# Patient Record
Sex: Female | Born: 1975 | Race: White | Hispanic: No | Marital: Married | Smoking: Former smoker
Health system: Southern US, Community
[De-identification: ages and names within clinical notes are randomized; demographics above are authoritative.]

## PROBLEM LIST (undated history)

## (undated) DIAGNOSIS — N390 Urinary tract infection, site not specified: Secondary | ICD-10-CM

## (undated) DIAGNOSIS — I10 Essential (primary) hypertension: Secondary | ICD-10-CM

## (undated) DIAGNOSIS — R002 Palpitations: Secondary | ICD-10-CM

## (undated) HISTORY — PX: TONSILLECTOMY: SUR1361

---

## 2006-10-06 ENCOUNTER — Emergency Department: Payer: Self-pay | Admitting: Emergency Medicine

## 2007-10-29 ENCOUNTER — Emergency Department: Payer: Self-pay | Admitting: Emergency Medicine

## 2007-10-30 ENCOUNTER — Other Ambulatory Visit: Payer: Self-pay

## 2008-04-19 ENCOUNTER — Emergency Department (HOSPITAL_COMMUNITY): Admission: EM | Admit: 2008-04-19 | Discharge: 2008-04-19 | Payer: Self-pay | Admitting: Emergency Medicine

## 2009-02-22 ENCOUNTER — Emergency Department (HOSPITAL_COMMUNITY): Admission: EM | Admit: 2009-02-22 | Discharge: 2009-02-22 | Payer: Self-pay | Admitting: Emergency Medicine

## 2009-02-27 ENCOUNTER — Ambulatory Visit: Payer: Self-pay

## 2009-02-27 ENCOUNTER — Encounter: Payer: Self-pay | Admitting: Cardiology

## 2009-02-27 ENCOUNTER — Ambulatory Visit: Payer: Self-pay | Admitting: Cardiology

## 2009-03-02 ENCOUNTER — Telehealth: Payer: Self-pay | Admitting: Cardiology

## 2009-12-03 ENCOUNTER — Emergency Department (HOSPITAL_COMMUNITY): Admission: EM | Admit: 2009-12-03 | Discharge: 2009-12-03 | Payer: Self-pay | Admitting: Family Medicine

## 2010-07-14 NOTE — L&D Delivery Note (Signed)
NSD of viable 7 pound 8 oz. Female, Apgars 9/9, over small 2nd degree episiotomy.  PDI.  Grossly normal.  2A/1V.  Episiotomy repaired as was a small periurethral tear on left.  Breast feed.

## 2010-10-20 LAB — POCT I-STAT, CHEM 8
Chloride: 101 mEq/L (ref 96–112)
HCT: 41 % (ref 36.0–46.0)
Potassium: 3.4 mEq/L — ABNORMAL LOW (ref 3.5–5.1)
TCO2: 26 mmol/L (ref 0–100)

## 2010-10-20 LAB — URINALYSIS, ROUTINE W REFLEX MICROSCOPIC
Bilirubin Urine: NEGATIVE
Glucose, UA: NEGATIVE mg/dL
Ketones, ur: NEGATIVE mg/dL
pH: 6 (ref 5.0–8.0)

## 2010-10-20 LAB — POCT CARDIAC MARKERS
CKMB, poc: <1 ng/mL — ABNORMAL LOW (ref 1.0–8.0)
Myoglobin, poc: 43.6 ng/mL (ref 12–200)
Myoglobin, poc: 44.1 ng/mL (ref 12–200)
Troponin i, poc: <0.05 ng/mL (ref 0.00–0.09)

## 2010-10-20 LAB — DIFFERENTIAL
Basophils Absolute: 0.1 10*3/uL (ref 0.0–0.1)
Basophils Relative: 1 % (ref 0–1)
Lymphocytes Relative: 25 % (ref 12–46)
Monocytes Absolute: 0.7 10*3/uL (ref 0.1–1.0)
Neutro Abs: 5.2 10*3/uL (ref 1.7–7.7)

## 2010-10-20 LAB — CBC
HCT: 39.9 % (ref 36.0–46.0)
Hemoglobin: 13.8 g/dL (ref 12.0–15.0)
Platelets: 288 10*3/uL (ref 150–400)

## 2010-10-20 LAB — D-DIMER, QUANTITATIVE: D-Dimer, Quant: <0.22 ug/mL-FEU (ref 0.00–0.48)

## 2011-06-08 ENCOUNTER — Inpatient Hospital Stay (HOSPITAL_COMMUNITY)
Admission: AD | Admit: 2011-06-08 | Discharge: 2011-06-09 | DRG: 775 | Disposition: A | Discharge status: Home / Self Care | Payer: Medicaid Other | Source: Ambulatory Visit | Attending: Obstetrics & Gynecology | Admitting: Obstetrics & Gynecology

## 2011-06-08 ENCOUNTER — Encounter (HOSPITAL_COMMUNITY): Payer: Self-pay | Admitting: Anesthesiology

## 2011-06-08 ENCOUNTER — Encounter (HOSPITAL_COMMUNITY): Payer: Self-pay | Admitting: Obstetrics & Gynecology

## 2011-06-08 ENCOUNTER — Encounter (HOSPITAL_COMMUNITY): Payer: Self-pay | Admitting: *Deleted

## 2011-06-08 ENCOUNTER — Inpatient Hospital Stay (HOSPITAL_COMMUNITY): Payer: Medicaid Other | Admitting: Anesthesiology

## 2011-06-08 DIAGNOSIS — Z22330 Carrier of Group B streptococcus: Secondary | ICD-10-CM

## 2011-06-08 DIAGNOSIS — O09529 Supervision of elderly multigravida, unspecified trimester: Secondary | ICD-10-CM | POA: Diagnosis present

## 2011-06-08 DIAGNOSIS — O99892 Other specified diseases and conditions complicating childbirth: Principal | ICD-10-CM | POA: Diagnosis present

## 2011-06-08 LAB — RPR: RPR Ser Ql: NONREACTIVE

## 2011-06-08 LAB — ABO/RH
ABO/RH(D): B NEG
RH Type: NEGATIVE

## 2011-06-08 LAB — POCT FERN TEST: Fern Test: POSITIVE

## 2011-06-08 LAB — CBC
MCH: 30.2 pg (ref 26.0–34.0)
Platelets: 227 10*3/uL (ref 150–400)
RBC: 3.67 MIL/uL — ABNORMAL LOW (ref 3.87–5.11)
WBC: 8.2 10*3/uL (ref 4.0–10.5)

## 2011-06-08 MED ORDER — EPHEDRINE 5 MG/ML INJ
10.0000 mg | INTRAVENOUS | Status: DC | PRN
  Filled 2011-06-08: qty 4

## 2011-06-08 MED ORDER — TERBUTALINE SULFATE 1 MG/ML IJ SOLN: 0.2500 mg | Freq: Once | INTRAMUSCULAR | Status: DC | PRN

## 2011-06-08 MED ORDER — GUAIFENESIN-DM 100-10 MG/5ML PO SYRP
5.0000 mL | ORAL_SOLUTION | ORAL | Status: DC | PRN
  Filled 2011-06-08: qty 5

## 2011-06-08 MED ORDER — LANOLIN HYDROUS EX OINT: TOPICAL_OINTMENT | CUTANEOUS | Status: DC | PRN

## 2011-06-08 MED ORDER — OXYTOCIN 20 UNITS IN LACTATED RINGERS INFUSION - SIMPLE: 125.0000 mL/h | Freq: Once | INTRAVENOUS | Status: DC

## 2011-06-08 MED ORDER — LACTATED RINGERS IV SOLN
INTRAVENOUS | Status: DC
  Administered 2011-06-08 (×2): via INTRAVENOUS

## 2011-06-08 MED ORDER — SIMETHICONE 80 MG PO CHEW: 80.0000 mg | CHEWABLE_TABLET | ORAL | Status: DC | PRN

## 2011-06-08 MED ORDER — DIPHENHYDRAMINE HCL 25 MG PO CAPS: 25.0000 mg | ORAL_CAPSULE | Freq: Four times a day (QID) | ORAL | Status: DC | PRN

## 2011-06-08 MED ORDER — SODIUM CHLORIDE 0.9 % IJ SOLN: 3.0000 mL | Freq: Two times a day (BID) | INTRAMUSCULAR | Status: DC

## 2011-06-08 MED ORDER — PENICILLIN G POTASSIUM 5000000 UNITS IJ SOLR
5.0000 10*6.[IU] | Freq: Once | INTRAVENOUS | Status: AC
  Administered 2011-06-08: 5 10*6.[IU] via INTRAVENOUS
  Filled 2011-06-08: qty 5

## 2011-06-08 MED ORDER — WITCH HAZEL-GLYCERIN EX PADS: 1.0000 "application " | MEDICATED_PAD | CUTANEOUS | Status: DC | PRN

## 2011-06-08 MED ORDER — ONDANSETRON HCL 4 MG/2ML IJ SOLN: 4.0000 mg | INTRAMUSCULAR | Status: DC | PRN

## 2011-06-08 MED ORDER — TETANUS-DIPHTH-ACELL PERTUSSIS 5-2.5-18.5 LF-MCG/0.5 IM SUSP
0.5000 mL | Freq: Once | INTRAMUSCULAR | Status: AC
  Administered 2011-06-09: 0.5 mL via INTRAMUSCULAR
  Filled 2011-06-08: qty 0.5

## 2011-06-08 MED ORDER — DIBUCAINE 1 % RE OINT: 1.0000 "application " | TOPICAL_OINTMENT | RECTAL | Status: DC | PRN

## 2011-06-08 MED ORDER — LIDOCAINE HCL (PF) 1 % IJ SOLN: 30.0000 mL | INTRAMUSCULAR | Status: DC | PRN

## 2011-06-08 MED ORDER — FENTANYL 2.5 MCG/ML BUPIVACAINE 1/10 % EPIDURAL INFUSION (WH - ANES)
INTRAMUSCULAR | Status: DC | PRN
  Administered 2011-06-08: 14 mL/h via EPIDURAL

## 2011-06-08 MED ORDER — ONDANSETRON HCL 4 MG/2ML IJ SOLN: 4.0000 mg | Freq: Four times a day (QID) | INTRAMUSCULAR | Status: DC | PRN

## 2011-06-08 MED ORDER — ZOLPIDEM TARTRATE 5 MG PO TABS: 5.0000 mg | ORAL_TABLET | Freq: Every evening | ORAL | Status: DC | PRN

## 2011-06-08 MED ORDER — SODIUM CHLORIDE 0.9 % IJ SOLN: 3.0000 mL | INTRAMUSCULAR | Status: DC | PRN

## 2011-06-08 MED ORDER — CITRIC ACID-SODIUM CITRATE 334-500 MG/5ML PO SOLN: 30.0000 mL | ORAL | Status: DC | PRN

## 2011-06-08 MED ORDER — ACETAMINOPHEN 325 MG PO TABS: 650.0000 mg | ORAL_TABLET | ORAL | Status: DC | PRN

## 2011-06-08 MED ORDER — OXYTOCIN 20 UNITS IN LACTATED RINGERS INFUSION - SIMPLE
1.0000 m[IU]/min | INTRAVENOUS | Status: DC
  Administered 2011-06-08: 1 m[IU]/min via INTRAVENOUS
  Administered 2011-06-08: 999 m[IU]/min via INTRAVENOUS
  Filled 2011-06-08: qty 1000

## 2011-06-08 MED ORDER — OXYCODONE-ACETAMINOPHEN 5-325 MG PO TABS: 2.0000 | ORAL_TABLET | ORAL | Status: DC | PRN

## 2011-06-08 MED ORDER — IBUPROFEN 600 MG PO TABS: 600.0000 mg | ORAL_TABLET | Freq: Four times a day (QID) | ORAL | Status: DC | PRN

## 2011-06-08 MED ORDER — SENNOSIDES-DOCUSATE SODIUM 8.6-50 MG PO TABS
2.0000 | ORAL_TABLET | Freq: Every day | ORAL | Status: DC
  Administered 2011-06-08: 2 via ORAL

## 2011-06-08 MED ORDER — ONDANSETRON HCL 4 MG PO TABS: 4.0000 mg | ORAL_TABLET | ORAL | Status: DC | PRN

## 2011-06-08 MED ORDER — IBUPROFEN 600 MG PO TABS
600.0000 mg | ORAL_TABLET | Freq: Four times a day (QID) | ORAL | Status: DC
  Administered 2011-06-08 – 2011-06-09 (×5): 600 mg via ORAL
  Filled 2011-06-08 (×5): qty 1

## 2011-06-08 MED ORDER — FENTANYL 2.5 MCG/ML BUPIVACAINE 1/10 % EPIDURAL INFUSION (WH - ANES)
14.0000 mL/h | INTRAMUSCULAR | Status: DC
  Administered 2011-06-08: 14 mL/h via EPIDURAL
  Filled 2011-06-08 (×2): qty 60

## 2011-06-08 MED ORDER — LACTATED RINGERS IV SOLN: 500.0000 mL | Freq: Once | INTRAVENOUS | Status: DC

## 2011-06-08 MED ORDER — LACTATED RINGERS IV SOLN
500.0000 mL | INTRAVENOUS | Status: DC | PRN
  Administered 2011-06-08: 1000 mL via INTRAVENOUS

## 2011-06-08 MED ORDER — DIPHENHYDRAMINE HCL 50 MG/ML IJ SOLN: 12.5000 mg | INTRAMUSCULAR | Status: DC | PRN

## 2011-06-08 MED ORDER — OXYCODONE-ACETAMINOPHEN 5-325 MG PO TABS
1.0000 | ORAL_TABLET | ORAL | Status: DC | PRN
  Administered 2011-06-09: 1 via ORAL
  Filled 2011-06-08: qty 1

## 2011-06-08 MED ORDER — PENICILLIN G POTASSIUM 5000000 UNITS IJ SOLR
2.5000 10*6.[IU] | INTRAVENOUS | Status: DC
  Administered 2011-06-08 (×2): 2.5 10*6.[IU] via INTRAVENOUS
  Filled 2011-06-08 (×5): qty 2.5

## 2011-06-08 MED ORDER — PHENYLEPHRINE 40 MCG/ML (10ML) SYRINGE FOR IV PUSH (FOR BLOOD PRESSURE SUPPORT)
80.0000 ug | PREFILLED_SYRINGE | INTRAVENOUS | Status: DC | PRN
  Filled 2011-06-08: qty 5

## 2011-06-08 MED ORDER — FLEET ENEMA 7-19 GM/118ML RE ENEM: 1.0000 | ENEMA | RECTAL | Status: DC | PRN

## 2011-06-08 MED ORDER — OXYTOCIN BOLUS FROM INFUSION
500.0000 mL | Freq: Once | INTRAVENOUS | Status: DC
  Filled 2011-06-08: qty 500

## 2011-06-08 MED ORDER — EPHEDRINE 5 MG/ML INJ: 10.0000 mg | INTRAVENOUS | Status: DC | PRN

## 2011-06-08 MED ORDER — LIDOCAINE HCL 1.5 % IJ SOLN
INTRAMUSCULAR | Status: DC | PRN
  Administered 2011-06-08 (×2): 5 mL via EPIDURAL

## 2011-06-08 MED ORDER — BENZOCAINE-MENTHOL 20-0.5 % EX AERO: 1.0000 "application " | INHALATION_SPRAY | CUTANEOUS | Status: DC | PRN

## 2011-06-08 MED ORDER — PHENYLEPHRINE 40 MCG/ML (10ML) SYRINGE FOR IV PUSH (FOR BLOOD PRESSURE SUPPORT): 80.0000 ug | PREFILLED_SYRINGE | INTRAVENOUS | Status: DC | PRN

## 2011-06-08 MED ORDER — GUAIFENESIN 100 MG/5ML PO SOLN
5.0000 mL | ORAL | Status: DC | PRN
  Administered 2011-06-08: 5 mL via ORAL
  Filled 2011-06-08: qty 15

## 2011-06-08 MED ORDER — SODIUM CHLORIDE 0.9 % IV SOLN: 250.0000 mL | INTRAVENOUS | Status: DC

## 2011-06-08 MED ORDER — PRENATAL PLUS 27-1 MG PO TABS
1.0000 | ORAL_TABLET | Freq: Every day | ORAL | Status: DC
  Administered 2011-06-08 – 2011-06-09 (×2): 1 via ORAL
  Filled 2011-06-08 (×2): qty 1

## 2011-06-08 NOTE — Anesthesia Procedure Notes (Signed)
Epidural Patient location during procedure: OB Start time: 06/08/2011 1:39 PM End time: 06/08/2011 1:44 PM Reason for block: procedure for pain  Staffing Anesthesiologist: Sandrea Hughs Performed by: anesthesiologist   Preanesthetic Checklist Completed: patient identified, site marked, surgical consent, pre-op evaluation, timeout performed, IV checked, risks and benefits discussed and monitors and equipment checked  Epidural Patient position: sitting Prep: site prepped and draped and DuraPrep Patient monitoring: continuous pulse ox and blood pressure Approach: midline Injection technique: LOR air  Needle:  Needle type: Tuohy  Needle gauge: 17 G Needle length: 9 cm Needle insertion depth: 4 cm Catheter type: closed end flexible Catheter size: 19 Gauge Catheter at skin depth: 9 cm Test dose: negative and 1.5% lidocaine  Assessment Sensory level: T10 Events: blood not aspirated, injection not painful, no injection resistance, negative IV test and no paresthesia

## 2011-06-08 NOTE — ED Notes (Signed)
Report called to [employer] in [facility] Pt to 164 ambulatory

## 2011-06-08 NOTE — Anesthesia Preprocedure Evaluation (Signed)
Anesthesia Evaluation  Patient identified by MRN, date of birth, ID band Patient awake    Reviewed: Allergy & Precautions, H&P , NPO status , Patient's Chart, lab work & pertinent test results  Airway Mallampati: II TM Distance: >3 FB Neck ROM: full    Dental No notable dental hx.    Pulmonary neg pulmonary ROS,    Pulmonary exam normal       Cardiovascular neg cardio ROS     Neuro/Psych Negative Neurological ROS  Negative Psych ROS   GI/Hepatic negative GI ROS, Neg liver ROS,   Endo/Other  Negative Endocrine ROS  Renal/GU negative Renal ROS  Genitourinary negative   Musculoskeletal negative musculoskeletal ROS (+)   Abdominal Normal abdominal exam  (+)   Peds negative pediatric ROS (+)  Hematology negative hematology ROS (+)   Anesthesia Other Findings   Reproductive/Obstetrics (+) Pregnancy                           Anesthesia Physical Anesthesia Plan  ASA: II  Anesthesia Plan: Epidural   Post-op Pain Management:    Induction:   Airway Management Planned:   Additional Equipment:   Intra-op Plan:   Post-operative Plan:   Informed Consent: I have reviewed the patients History and Physical, chart, labs and discussed the procedure including the risks, benefits and alternatives for the proposed anesthesia with the patient or authorized representative who has indicated his/her understanding and acceptance.     Plan Discussed with:   Anesthesia Plan Comments:         Anesthesia Quick Evaluation  

## 2011-06-08 NOTE — Progress Notes (Signed)
G2P1 at 38.3wks. Had just returned from [place] and water broke. Leaking a lot of clear fld and conts to leak. "No ctxs since water broke"

## 2011-06-08 NOTE — Anesthesia Postprocedure Evaluation (Signed)
Anesthesia Post Note  Patient: Kristi Haynes  Procedure(s) Performed: * No procedures listed *  Anesthesia type: Epidural  Patient location: Mother/Baby  Post pain: Pain level controlled  Post assessment: Post-op Vital signs reviewed  Last Vitals:  Filed Vitals:   06/08/11 1616  BP: 123/77  Pulse: 82  Temp:   Resp: 20    Post vital signs: Reviewed  Level of consciousness: awake  Complications: No apparent anesthesia complications

## 2011-06-08 NOTE — H&P (Signed)
35 year old G2P1 at 96 weeks, EDC 06/26/11, with SROM at about 5AM today with onset of contractions about an hour later.  +GBS.  Pregnancy otherwise relatively benign.  PE:  VS stable.   Abd:  S+ 37  FH reassuring. Cx:  3-4/50/vtx -2; fluid clear.  IMP:  38 weeks IUP/early labor and SROM Plan:  Pitocin augmentation.  Expectant management.

## 2011-06-09 LAB — CBC
Hemoglobin: 9.3 g/dL — ABNORMAL LOW (ref 12.0–15.0)
MCHC: 34.3 g/dL (ref 30.0–36.0)
Platelets: 176 10*3/uL (ref 150–400)
RDW: 13.8 % (ref 11.5–15.5)

## 2011-06-09 MED ORDER — RHO D IMMUNE GLOBULIN 1500 UNIT/2ML IJ SOLN
300.0000 ug | Freq: Once | INTRAMUSCULAR | Status: AC
  Administered 2011-06-09: 300 ug via INTRAMUSCULAR
  Filled 2011-06-09: qty 2

## 2011-06-09 NOTE — Progress Notes (Signed)
UR chart review completed.  

## 2011-06-09 NOTE — Progress Notes (Signed)
Patient has presented with a nose bleed.  She states she has had a cold.  Cold saline and tissues given to patient.  Patient instructed to call, nose bleed persist.

## 2011-06-09 NOTE — Discharge Summary (Signed)
Obstetric Discharge Summary Reason for Admission: SROM Prenatal Procedures: none Intrapartum Procedures: none Postpartum Procedures: none Complications-Operative and Postpartum: none Hemoglobin  Date Value Range Status  06/09/2011 9.3* 12.0-15.0 (g/dL) Final     HCT  Date Value Range Status  06/09/2011 27.1* 36.0-46.0 (%) Final    Discharge Diagnoses: NSVD  Discharge Information: Date: 06/09/2011 Activity: as tolerated Diet: regular Medications: iron/colace/ibuprofen/PNV Condition:stable Instructions: see specific packet Discharge HQ:[facility]G Follow-up Information    Follow up with WEIN,ROBERT M in 4 weeks.   Contact information:   [address] [place] [address] [phone]          Newborn Data: Live born female  Birth Weight: 7 lb 8 oz (3402 g) APGAR: 9, 9  Home with mother.  Philip Aspen 06/09/2011, 8:27 PM

## 2011-06-09 NOTE — Progress Notes (Signed)
Patient is eating, ambulating, voiding.  Pain control is good.  Filed Vitals:   06/08/11 1845 06/08/11 2200 06/09/11 0530 06/09/11 1406  BP: 136/95 130/81 122/77 111/75  Pulse:  80 71 74  Temp:  98.8 F (37.1 C) 97.8 F (36.6 C) 98.7 F (37.1 C)  TempSrc:  Oral Oral Oral  Resp:  18 18 18   Height:      Weight:      SpO2:  99%      Fundus firm Ext: no CT  Lab Results  Component Value Date   WBC 9.8 06/09/2011   HGB 9.3* 06/09/2011   HCT 27.1* 06/09/2011   MCV 89.4 06/09/2011   PLT 176 06/09/2011    --/--/B NEG (11/26 0525)  A/P Post partum day 1 Pt has not decided if she wants to go home today. Cont PNV with Fe, colace prn  Routine care.  Expect d/c per plan.    Philip Aspen

## 2011-06-09 NOTE — Progress Notes (Signed)
Notified Dr. Claiborne Billings that patient would now like to go home today.  Dr. Claiborne Billings stated that patient could take over the counter iron, colace and ibuprofen and to continue prenatal vitamins and could be d/c'd today.  MD will write orders shortly. Earl Gala, Linda Hedges [place]

## 2011-06-10 LAB — RH IG WORKUP (INCLUDES ABO/RH)
Gestational Age(Wks): 38.2
Unit division: 0

## 2011-06-13 ENCOUNTER — Ambulatory Visit (HOSPITAL_COMMUNITY)
Admission: RE | Admit: 2011-06-13 | Discharge: 2011-06-13 | Disposition: A | Discharge status: Home / Self Care | Payer: Medicaid Other | Source: Ambulatory Visit | Attending: Obstetrics & Gynecology | Admitting: Obstetrics & Gynecology

## 2011-06-13 NOTE — Progress Notes (Signed)
Adult Lactation Consultation Outpatient Visit Note  Patient Name: Kristi Haynes       Baby Girl Kristi Haynes DOB [birth year]  Birth weight 7 lb 42 oz.  11 days old today Date of Birth: Jun 20, 1976 Gestational Age at Delivery: [redacted]w[redacted]d Type of Delivery: SVB  Breastfeeding History: Frequency of Breastfeeding: 1 1/2 to 2 hours Length of Feeding: 10 min Voids: 3/day Stools: 4/day  Yellow in color  Supplementing / Method: Pumping:  Type of Pump:  Harmony HP   Frequency:  none  Volume:    Comments: Mom is here today c/o sore nipples and engorgement. Baby was cluster feeding every 30-45 minutes till 2 days ago when mom's milk began transitioning. Breastfeeding went well yesterday, but starting last night breasts became very firm/hard and the baby has not been able to obtain a deep latch. On exam bil breasts are engorged with firm nodules in outer quadrants and top of breast, bil nipples are bruised,cracked with positional stripe. Mom has tried to pump to relieve engorgement with hand pump but c/o too painful on nipples. Using hand expression, expressed 10ml of milk from right breast. Assisted mom to latch the baby to right breast, was very painful with initial latch but improved after couple of minutes and mom was able to breast feed for 20 minutes with the baby transferring 44 ml of milk after weight check. Right breast softened with massage during hand expression and nursing. Nodules still present but softer and mom reports less discomfort. Baby was very sleepy at this feeding and would not wake to breast feed on left breast. With hand expression received approx 1-2 ml of milk from left breast. Set up DEBP to check flange and post-pump to relieve engorgement. Size 24 flange is correct for mom and she reports felt better using pump after hand expressing some milk. Mom pumped a total of 39 ml, (26 ml from right breast and 13 ml from left). Mom reported breasts felt better and nursing/pumping.    Consultation  Evaluation:  Initial Feeding Assessment: Pre-feed Weight:  7 lb. 2.4 oz/3244gm Post-feed Weight:  7 lb. 4.0 oz/3288gm Amount Transferred:  44ml Comments:  Right breast  Additional Feeding Assessment: Pre-feed Weight: Post-feed Weight: Amount Transferred: Comments:  Additional Feeding Assessment: Pre-feed Weight: Post-feed Weight: Amount Transferred: Comments:  Total Breast milk Transferred this Visit: 44 ml Total Supplement Given: None  Additional Interventions: Gave mom comfort gels along with shells and lanolin for sore nipples. Discussed care of sore nipples. Plan for engorgement care given to mom. Advised her not to miss any feedings and wake baby to BF every 2 hours till engorgement improves. Hand express of pre-pump to soften aerola, keep the baby actively nursing at least 15-20 minutes if taking both breasts. If the baby will only BF on 1 breast per feeding, keep the baby nursing for 20-30 minutes to empty breast, then pump the other breast. If the baby starts to BF on both breasts each feeding, then only post-pump if needed for comfort, no more than 10-15 minutes. Massage breast while nursing or pumping to soften nodules and prevent a clogged duct.  Use ice packs after nursing, take motrin as directed by physician, use cabbage leaves for comfort. Encourage rest. Sign and symptoms of breast infection reviewed and advised to call if develop. Advised to call if no improvement in the next 24-48 hours.   Follow-Up  Prn.    Alfred Levins 06/13/2011, 12:27 PM

## 2011-06-19 ENCOUNTER — Encounter (HOSPITAL_COMMUNITY): Payer: Self-pay | Admitting: *Deleted

## 2011-06-19 ENCOUNTER — Inpatient Hospital Stay (HOSPITAL_COMMUNITY)
Admission: AD | Admit: 2011-06-19 | Discharge: 2011-06-19 | Disposition: A | Discharge status: Home / Self Care | Payer: Medicaid Other | Source: Ambulatory Visit | Attending: Obstetrics and Gynecology | Admitting: Obstetrics and Gynecology

## 2011-06-19 DIAGNOSIS — O99893 Other specified diseases and conditions complicating puerperium: Secondary | ICD-10-CM | POA: Insufficient documentation

## 2011-06-19 DIAGNOSIS — R030 Elevated blood-pressure reading, without diagnosis of hypertension: Secondary | ICD-10-CM | POA: Insufficient documentation

## 2011-06-19 HISTORY — DX: Urinary tract infection, site not specified: N39.0

## 2011-06-19 HISTORY — DX: Palpitations: R00.2

## 2011-06-19 LAB — URINALYSIS, ROUTINE W REFLEX MICROSCOPIC
Bilirubin Urine: NEGATIVE
Glucose, UA: NEGATIVE mg/dL
Protein, ur: NEGATIVE mg/dL

## 2011-06-19 LAB — COMPREHENSIVE METABOLIC PANEL
ALT: 23 U/L (ref 0–35)
Alkaline Phosphatase: 100 U/L (ref 39–117)
BUN: 15 mg/dL (ref 6–23)
CO2: 23 mEq/L (ref 19–32)
Chloride: 103 mEq/L (ref 96–112)
GFR calc Af Amer: >90 mL/min (ref >90)
Glucose, Bld: 126 mg/dL — ABNORMAL HIGH (ref 70–99)
Potassium: 3.7 mEq/L (ref 3.5–5.1)
Sodium: 138 mEq/L (ref 135–145)
Total Bilirubin: 0.2 mg/dL — ABNORMAL LOW (ref 0.3–1.2)
Total Protein: 6.4 g/dL (ref 6.0–8.3)

## 2011-06-19 LAB — CBC
HCT: 34.1 % — ABNORMAL LOW (ref 36.0–46.0)
Hemoglobin: 11.5 g/dL — ABNORMAL LOW (ref 12.0–15.0)
MCHC: 33.7 g/dL (ref 30.0–36.0)
RBC: 3.8 MIL/uL — ABNORMAL LOW (ref 3.87–5.11)
WBC: 8.4 10*3/uL (ref 4.0–10.5)

## 2011-06-19 LAB — URINE MICROSCOPIC-ADD ON

## 2011-06-19 MED ORDER — ACETAMINOPHEN 500 MG PO TABS
1000.0000 mg | ORAL_TABLET | Freq: Once | ORAL | Status: AC
  Administered 2011-06-19: 1000 mg via ORAL
  Filled 2011-06-19: qty 2

## 2011-06-19 NOTE — ED Notes (Signed)
Breast feeding is going well. Baby is accepted by other sibling.  Transitioning well., baby is healthy.

## 2011-06-19 NOTE — Progress Notes (Signed)
Delivered on 11/25, vaginal.. Denies BP complication during preg.  Baby love nurse in yesterday to check on infant, BP was up. Dr's office called was told to come here for f/u.

## 2011-06-19 NOTE — ED Provider Notes (Signed)
History     No chief complaint on file.  HPI Kristi Haynes y.o.presents with elevation of blood pressure taken by the baby love nurse. Told it was 150-160.  She was on her way to [facility] and got a call to come here for evaluation.  Delivered 11/25, NSVD without complications.  Reports uncomplicated pregnancy as well. Reports headache tha tcomes and goes began 3 days ago, points to temples and a little pressure behind her eyes.  Swelling in her feet.  Reports palpitations earlier this week.  Denies visual changes.  Took 600mg  of Ibuprofen at 1pm without relief  Past Medical History  Diagnosis Date  . Heart palpitations   . Urinary tract infection     Past Surgical History  Procedure Date  . Tonsillectomy     "3rd tonsil" removed    Family History  Problem Relation Age of Onset  . Anesthesia problems Neg Hx     History  Substance Use Topics  . Smoking status: Former Smoker    Quit date: 10/05/2009  . Smokeless tobacco: Never Used  . Alcohol Use: No    Allergies: No Known Allergies  Prescriptions prior to admission  Medication Sig Dispense Refill  . Calcium Carbonate Antacid (TUMS PO) Take 6 tablets by mouth daily.        . prenatal vitamin w/FE, FA (PRENATAL 1 + 1) 27-1 MG TABS Take 1 tablet by mouth daily.          Review of Systems  Constitutional: Negative.   Gastrointestinal: Negative for nausea, vomiting and abdominal pain.  Musculoskeletal:       Swelling in feet bilaterally  Neurological: Positive for headaches (temporal and pressure behind her eyes).   Physical Exam   Blood pressure 140/75, pulse 62, temperature 97.4 F (36.3 C), temperature source Oral, resp. rate 18, last menstrual period 08/19/2010, unknown if currently breastfeeding. Physical Exam  Constitutional: She is oriented to person, place, and time. She appears well-developed and well-nourished. No distress.  Musculoskeletal:        bilateral pitting edema below knee.  Neurological: She is  alert and oriented to person, place, and time. A cranial nerve deficit is present.  Skin: Skin is warm and dry.   Results for orders placed during the hospital encounter of 06/19/11 (from the past 24 hour(s))  CBC     Status: Abnormal   Collection Time   06/19/11  6:08 PM      Component Value Range   WBC 8.4  4.0 - 10.5 (K/uL)   RBC 3.80 (*) 3.87 - 5.11 (MIL/uL)   Hemoglobin 11.5 (*) 12.0 - 15.0 (g/dL)   HCT 28.4 (*) 13.2 - 46.0 (%)   MCV 89.7  78.0 - 100.0 (fL)   MCH 30.3  26.0 - 34.0 (pg)   MCHC 33.7  30.0 - 36.0 (g/dL)   RDW 44.0  10.2 - 72.5 (%)   Platelets 319  150 - 400 (K/uL)  COMPREHENSIVE METABOLIC PANEL     Status: Abnormal   Collection Time   06/19/11  6:08 PM      Component Value Range   Sodium 138  135 - 145 (mEq/L)   Potassium 3.7  3.5 - 5.1 (mEq/L)   Chloride 103  96 - 112 (mEq/L)   CO2 23  19 - 32 (mEq/L)   Glucose, Bld 126 (*) 70 - 99 (mg/dL)   BUN 15  6 - 23 (mg/dL)   Creatinine, Ser 3.66  0.50 - 1.10 (mg/dL)  Calcium 8.8  8.4 - 10.5 (mg/dL)   Total Protein 6.4  6.0 - 8.3 (g/dL)   Albumin 2.8 (*) 3.5 - 5.2 (g/dL)   AST 21  0 - 37 (U/L)   ALT 23  0 - 35 (U/L)   Alkaline Phosphatase 100  39 - 117 (U/L)   Total Bilirubin 0.2 (*) 0.3 - 1.2 (mg/dL)   GFR calc non Af Amer >90  >90 (mL/min)   GFR calc Af Amer >90  >90 (mL/min)  LACTATE DEHYDROGENASE     Status: Normal   Collection Time   06/19/11  6:08 PM      Component Value Range   LD 221  94 - 250 (U/L)  URIC ACID     Status: Normal   Collection Time   06/19/11  6:08 PM      Component Value Range   Uric Acid, Serum 5.6  2.4 - 7.0 (mg/dL)  URINALYSIS, ROUTINE W REFLEX MICROSCOPIC     Status: Abnormal   Collection Time   06/19/11  6:30 PM      Component Value Range   Color, Urine YELLOW  YELLOW    APPearance CLEAR  CLEAR    Specific Gravity, Urine 1.020  1.005 - 1.030    pH 6.0  5.0 - 8.0    Glucose, UA NEGATIVE  NEGATIVE (mg/dL)   Hgb urine dipstick SMALL (*) NEGATIVE    Bilirubin Urine NEGATIVE   NEGATIVE    Ketones, ur NEGATIVE  NEGATIVE (mg/dL)   Protein, ur NEGATIVE  NEGATIVE (mg/dL)   Urobilinogen, UA 0.2  0.0 - 1.0 (mg/dL)   Nitrite NEGATIVE  NEGATIVE    Leukocytes, UA TRACE (*) NEGATIVE   URINE MICROSCOPIC-ADD ON     Status: Abnormal   Collection Time   06/19/11  6:30 PM      Component Value Range   Squamous Epithelial / LPF RARE  RARE    WBC, UA 0-2  <3 (WBC/hpf)   RBC / HPF 0-2  <3 (RBC/hpf)   Bacteria, UA FEW (*) RARE     Filed Vitals:   06/19/11 1912  BP: 133/81  Pulse: 50  Temp:   Resp:    MAU Course  Procedures  MDM 17:40 Reported to Dr. Henderson Cloud, the patient's sxs.  She is known to Dr. Henderson Cloud.  Orders given for PIH labs, UA, serial BP's and tylenol for headache.  19:14 Reported labs, BP readings and resolution of headache to Dr. Henderson Cloud.  Order given for discharge with instructions to stay well hydrated, no salt and call if needed.  Assessment and Plan  A:  Elevated blood pressure post partum  P:  Instructed to stay well hydrated, eat no salt and call for worsening sxs.  Keep scheduled appointment  Kristi Haynes,[name] 06/19/2011, 5:42 PM   [name], NP 06/19/11 1920

## 2014-05-15 ENCOUNTER — Encounter (HOSPITAL_COMMUNITY): Payer: Self-pay | Admitting: *Deleted

## 2014-06-02 ENCOUNTER — Ambulatory Visit (INDEPENDENT_AMBULATORY_CARE_PROVIDER_SITE_OTHER): Payer: No Typology Code available for payment source | Admitting: Family Medicine

## 2014-06-02 VITALS — BP 140/80 | HR 79 | Temp 98.1°F | Resp 16 | Ht 65.0 in | Wt 153.0 lb

## 2014-06-02 DIAGNOSIS — R059 Cough, unspecified: Secondary | ICD-10-CM

## 2014-06-02 DIAGNOSIS — R05 Cough: Secondary | ICD-10-CM

## 2014-06-02 DIAGNOSIS — J45909 Unspecified asthma, uncomplicated: Secondary | ICD-10-CM

## 2014-06-02 MED ORDER — ALBUTEROL SULFATE HFA 108 (90 BASE) MCG/ACT IN AERS: 2.0000 | INHALATION_SPRAY | RESPIRATORY_TRACT | Status: DC | PRN

## 2014-06-02 MED ORDER — AMOXICILLIN 875 MG PO TABS: 875.0000 mg | ORAL_TABLET | Freq: Two times a day (BID) | ORAL | Status: DC

## 2014-06-02 MED ORDER — BENZONATATE 100 MG PO CAPS: 100.0000 mg | ORAL_CAPSULE | Freq: Three times a day (TID) | ORAL | Status: DC | PRN

## 2014-06-02 MED ORDER — HYDROCODONE-HOMATROPINE 5-1.5 MG/5ML PO SYRP: 5.0000 mL | ORAL_SOLUTION | ORAL | Status: DC | PRN

## 2014-06-02 NOTE — Progress Notes (Signed)
Subjective: 38 year old lady who has a two-week history of her respiratory tract infection. It is settled into a persistent cough. She gets so much mucus buildup and coughing at night that she cannot rest. She does not smoke. She works as a Armed forces operational officerdental hygienist. She is generally healthy and does not get a lot of these infections. One of her children was recently ill a little bit. She does have a bad headache when she coughs a lot.  Objective: Healthy-appearing lady in no major distress. Does have some cough and congestion. Her TMs are normal. Throat clear. Neck supple without significant nodes. Chest is clear to auscultation. However, at end expiration there is a tiny wheeze. When she does forced expiration it wheezes more and causes her to a spasm and cough. Heart regular without murmurs.  Assessment: Asthmatic bronchitis Cough Headache

## 2014-06-02 NOTE — Patient Instructions (Signed)
Take amoxicillin one twice daily  Use the hydrocodone cough syrup 1 teaspoon every 4-6 hours as needed for cough. It does tend to sedate a little bit.  Use the benzonatate cough pills 1 or 2 pills 3 times daily as needed for cough when you cannot afford to be sedated.  Use the albuterol inhaler 2 relations every 4-6 hours as directed  Drink plenty of fluids and get sufficient rest for the body to beat this infection off  Return if worse at anytime.

## 2014-06-13 ENCOUNTER — Encounter: Payer: Self-pay | Admitting: Family Medicine

## 2014-06-13 ENCOUNTER — Ambulatory Visit (INDEPENDENT_AMBULATORY_CARE_PROVIDER_SITE_OTHER): Payer: No Typology Code available for payment source | Admitting: Family Medicine

## 2014-06-13 VITALS — BP 140/92 | HR 80 | Temp 97.9°F | Resp 16 | Ht 64.5 in | Wt 152.0 lb

## 2014-06-13 DIAGNOSIS — J209 Acute bronchitis, unspecified: Secondary | ICD-10-CM

## 2014-06-13 DIAGNOSIS — R059 Cough, unspecified: Secondary | ICD-10-CM

## 2014-06-13 DIAGNOSIS — R05 Cough: Secondary | ICD-10-CM

## 2014-06-13 MED ORDER — PREDNISONE 20 MG PO TABS: ORAL_TABLET | ORAL | Status: DC

## 2014-06-13 MED ORDER — HYDROCOD POLST-CHLORPHEN POLST 10-8 MG/5ML PO LQCR: 5.0000 mL | Freq: Every evening | ORAL | Status: DC | PRN

## 2014-06-13 NOTE — Patient Instructions (Signed)
Cough, Adult  A cough is a reflex that helps clear your throat and airways. It can help heal the body or may be a reaction to an irritated airway. A cough may only last 2 or 3 weeks (acute) or may last more than 8 weeks (chronic).  CAUSES Acute cough:  Viral or bacterial infections. Chronic cough:  Infections.  Allergies.  Asthma.  Post-nasal drip.  Smoking.  Heartburn or acid reflux.  Some medicines.  Chronic lung problems (COPD).  Cancer. SYMPTOMS   Cough.  Fever.  Chest pain.  Increased breathing rate.  High-pitched whistling sound when breathing (wheezing).  Colored mucus that you cough up (sputum). TREATMENT   A bacterial cough may be treated with antibiotic medicine.  A viral cough must run its course and will not respond to antibiotics.  Your caregiver may recommend other treatments if you have a chronic cough. HOME CARE INSTRUCTIONS   Only take over-the-counter or prescription medicines for pain, discomfort, or fever as directed by your caregiver. Use cough suppressants only as directed by your caregiver.  Use a cold steam vaporizer or humidifier in your bedroom or home to help loosen secretions.  Sleep in a semi-upright position if your cough is worse at night.  Rest as needed.  Stop smoking if you smoke. SEEK IMMEDIATE MEDICAL CARE IF:   You have pus in your sputum.  Your cough starts to worsen.  You cannot control your cough with suppressants and are losing sleep.  You begin coughing up blood.  You have difficulty breathing.  You develop pain which is getting worse or is uncontrolled with medicine.  You have a fever. MAKE SURE YOU:   Understand these instructions.  Will watch your condition.  Will get help right away if you are not doing well or get worse. Document Released: 12/27/2010 Document Revised: 09/22/2011 Document Reviewed: 12/27/2010 Exit[employer] Patient Information 2015 [employer]. This information is not intended  to replace advice given to you by your health care provider. Make sure you discuss any questions you have with your health care provider.  

## 2014-06-13 NOTE — Progress Notes (Signed)
   Subjective:    Patient ID: Kristi FujitaStephanie Saran, female    DOB: 1976/01/08, 38 y.o.   MRN: [id number]  HPI Patient presents today for follow up of URI. She was seen by Dr. Alwyn RenHopper 11/20 and ws prescribed hycodan, tessalon perles, amoxicillin and albuterol inhaler. She was unable to afford to get her albuterol inhaler filled, but picked up her other prescriptions. She finished her amoxicillin yesterday, but continues to cough mostly in the morning and and at night. The cough is keeping her up at night, she is getting at most 2 hours of sleep at a time. Used hycodan and was able to get to sleep eventually. Also tried delsym, tessalon perles and robitussin without significant relief. She feels better since being on amoxicillin other than the persistent cough.   No history of asthma, + past smoking history, no recent tobacco use.   Review of Systems Feels chest tightness and SOB, little phlegm production, headache and earache resolved, occasion nasal drainage- clear. Feels achy from coughing. Stress urinary incontinence with repeated cough.     Objective:   Physical Exam  Constitutional: She is oriented to person, place, and time. She appears well-developed and well-nourished.  HENT:  Head: Normocephalic and atraumatic.  Right Ear: Tympanic membrane, external ear and ear canal normal.  Left Ear: Tympanic membrane, external ear and ear canal normal.  Nose: Mucosal edema and rhinorrhea present. Right sinus exhibits no maxillary sinus tenderness and no frontal sinus tenderness. Left sinus exhibits no maxillary sinus tenderness and no frontal sinus tenderness.  Mouth/Throat: Uvula is midline and mucous membranes are normal. Posterior oropharyngeal erythema present. No oropharyngeal exudate, posterior oropharyngeal edema or tonsillar abscesses.  Eyes: Conjunctivae are normal.  Neck: Normal range of motion. Neck supple.  Cardiovascular: Normal rate, regular rhythm and normal heart sounds.     Pulmonary/Chest: Effort normal and breath sounds normal. No respiratory distress. She has no wheezes. She has no rales. She exhibits no tenderness.  Musculoskeletal: Normal range of motion.  Lymphadenopathy:    She has no cervical adenopathy.  Neurological: She is alert and oriented to person, place, and time.  Skin: Skin is warm and dry.  Psychiatric: She has a normal mood and affect. Her behavior is normal. Judgment and thought content normal.  Vitals reviewed. BP 140/92 mmHg  Pulse 80  Temp(Src) 97.9 F (36.6 C) (Oral)  Resp 16  Ht 5' 4.5" (1.638 m)  Wt 152 lb (68.947 kg)  BMI 25.70 kg/m2  SpO2 99%  LMP 05/31/2014     Assessment & Plan:  Discussed with Dr. Cleta Albertsaub 1. Cough - chlorpheniramine-HYDROcodone (TUSSIONEX PENNKINETIC ER) 10-8 MG/5ML LQCR; Take 5 mLs by mouth at bedtime as needed.  Dispense: 70 mL; Refill: 0 - predniSONE (DELTASONE) 20 MG tablet; Take 3,3,3,2,2,2,1,1,1  Dispense: 18 tablet; Refill: 0  2. Acute bronchitis, unspecified organism - chlorpheniramine-HYDROcodone (TUSSIONEX PENNKINETIC ER) 10-8 MG/5ML LQCR; Take 5 mLs by mouth at bedtime as needed.  Dispense: 70 mL; Refill: 0 - predniSONE (DELTASONE) 20 MG tablet; Take 3,3,3,2,2,2,1,1,1  Dispense: 18 tablet; Refill: 0 -Found discount coupon for albuterol inhaler for patient. She will fill if no better in 2 days - Follow up if no improvement in 3-4 days, sooner if worsening symptoms.   Emi Belfasteborah B. Gessner, FNP-BC  [facility]  06/13/2014 11:43 AM

## 2014-06-15 ENCOUNTER — Telehealth: Payer: Self-pay

## 2014-06-15 NOTE — Telephone Encounter (Signed)
Spoke with patient, she is only taking tussionex at night, not during day.    Spoke with Gurney MaxinMike Mani and Sherril CroonMike Clark (PA's) regarding patient. They reviewed patient's chart and concerns, advised me to have patient stop all cough syrups--tussionex and hycodan if still taking.  If she feels like she is going to pass out or fall, RTC.  Continue prednisone and call us tomorrow with symptom status.  Spoke with patient, sometimes when has dizzy spells she will feel like she could fall if she doesn't sit down, started today. She feels like she has a good "buzz".  Advised patient to RTC.  She states if she has another dizzy spell she will RTC, if not then she will call us tomorrow with how she is doing. She will stop all cough syrups.

## 2014-06-15 NOTE — Telephone Encounter (Signed)
PT WAS SEEN BY DEBBIE GESSNER 06/13/14, PT IS CALLING TO ADVISE COUGH IS MUCH BETTER, BUT NOW HAVING SYMPTOMS OF DIZZINESS AND SLEEPINESS, STATES SHE FEELS "DRUNK" PLEASE CALL PT AT WORK AT EITHER [phone] OR [phone]

## 2015-03-06 ENCOUNTER — Other Ambulatory Visit: Payer: Self-pay

## 2015-03-06 ENCOUNTER — Other Ambulatory Visit: Payer: Self-pay | Admitting: Obstetrics & Gynecology

## 2015-03-06 DIAGNOSIS — Z1231 Encounter for screening mammogram for malignant neoplasm of breast: Secondary | ICD-10-CM

## 2015-03-06 DIAGNOSIS — Z803 Family history of malignant neoplasm of breast: Secondary | ICD-10-CM

## 2015-04-11 ENCOUNTER — Ambulatory Visit (INDEPENDENT_AMBULATORY_CARE_PROVIDER_SITE_OTHER): Payer: 59 | Admitting: Family Medicine

## 2015-04-11 ENCOUNTER — Ambulatory Visit: Payer: 59

## 2015-04-11 ENCOUNTER — Ambulatory Visit (INDEPENDENT_AMBULATORY_CARE_PROVIDER_SITE_OTHER): Payer: 59

## 2015-04-11 VITALS — BP 122/72 | HR 63 | Temp 98.4°F | Resp 17 | Ht 64.5 in | Wt 144.0 lb

## 2015-04-11 DIAGNOSIS — M546 Pain in thoracic spine: Secondary | ICD-10-CM | POA: Insufficient documentation

## 2015-04-11 DIAGNOSIS — R0781 Pleurodynia: Secondary | ICD-10-CM

## 2015-04-11 DIAGNOSIS — R0789 Other chest pain: Secondary | ICD-10-CM | POA: Diagnosis not present

## 2015-04-11 DIAGNOSIS — M542 Cervicalgia: Secondary | ICD-10-CM | POA: Diagnosis not present

## 2015-04-11 LAB — LIPID PANEL
CHOL/HDL RATIO: 2.7 ratio (ref <=5.0)
Cholesterol: 189 mg/dL (ref 125–200)
HDL: 70 mg/dL (ref >=46)
LDL CALC: 107 mg/dL (ref <130)
TRIGLYCERIDES: 61 mg/dL (ref <150)
VLDL: 12 mg/dL (ref <30)

## 2015-04-11 LAB — COMPREHENSIVE METABOLIC PANEL
ALBUMIN: 4.5 g/dL (ref 3.6–5.1)
ALT: 6 U/L (ref 6–29)
AST: 15 U/L (ref 10–30)
Alkaline Phosphatase: 44 U/L (ref 33–115)
BUN: 10 mg/dL (ref 7–25)
CALCIUM: 9.6 mg/dL (ref 8.6–10.2)
CHLORIDE: 103 mmol/L (ref 98–110)
CO2: 28 mmol/L (ref 20–31)
Creat: 0.51 mg/dL (ref 0.50–1.10)
Glucose, Bld: 86 mg/dL (ref 65–99)
POTASSIUM: 4.5 mmol/L (ref 3.5–5.3)
SODIUM: 138 mmol/L (ref 135–146)
Total Bilirubin: 0.5 mg/dL (ref 0.2–1.2)
Total Protein: 7.4 g/dL (ref 6.1–8.1)

## 2015-04-11 LAB — POCT URINE PREGNANCY: Preg Test, Ur: NEGATIVE

## 2015-04-11 LAB — TROPONIN I: Troponin I: <0.01 ng/mL (ref <0.06)

## 2015-04-11 MED ORDER — CYCLOBENZAPRINE HCL 10 MG PO TABS: 5.0000 mg | ORAL_TABLET | Freq: Every day | ORAL | Status: DC

## 2015-04-11 MED ORDER — MELOXICAM 15 MG PO TABS: 7.5000 mg | ORAL_TABLET | Freq: Every day | ORAL | Status: DC

## 2015-04-11 NOTE — Progress Notes (Signed)
MRN: [id number] DOB: 04-23-1976  Subjective:   Kristi Haynes is a 39 y.o. female presenting for chief complaint of Chest Pain  Reports new onset of left-sided thoracic back pain this morning that penetrates into her chest wraps around her left rib, axilla and down her left arm up to the elbow. She also states that she has left-sided posterior neck pain. Denies fever, chest pain, shortness of breath, heart racing, palpitations, diaphoresis, jaw pain, nausea, vomiting, abdominal pain, dizziness. Denies smoking cigarettes, has occasional drink of alcohol. Admits family history of MI with her father, states that he had two in his 22s. Her father iwas a heavy smoker and drinker. Denies any other aggravating or relieving factors, no other questions or concerns.  Kristi Haynes currently has no medications in their medication list. Also has No Known Allergies.  Kristi Haynes  has a past medical history of Heart palpitations and Urinary tract infection. Also  has past surgical history that includes Tonsillectomy.  Objective:   Vitals: BP 122/72 mmHg  Pulse 63  Temp(Src) 98.4 F (36.9 C) (Oral)  Resp 17  Ht 5' 4.5" (1.638 m)  Wt 144 lb (65.318 kg)  BMI 24.34 kg/m2  SpO2 97%  Breastfeeding? No  Physical Exam  Constitutional: She is oriented to person, place, and time. She appears well-developed and well-nourished.  HENT:  Mouth/Throat: Oropharynx is clear and moist.  Eyes: Conjunctivae are normal. Pupils are equal, round, and reactive to light. No scleral icterus.  Neck: Normal range of motion. Neck supple. No JVD present. No thyromegaly present.  Cardiovascular: Normal rate, regular rhythm and intact distal pulses.  Exam reveals no gallop and no friction rub.   No murmur heard. Pulmonary/Chest: No respiratory distress. She has no wheezes. She has no rales. She exhibits no mass, no tenderness, no bony tenderness, no laceration, no crepitus, no edema, no deformity and no retraction.    Abdominal: Soft. Bowel sounds are normal. She exhibits no distension and no mass. There is no tenderness.  Musculoskeletal: She exhibits no edema.       Thoracic back: She exhibits normal range of motion, no tenderness, no bony tenderness, no swelling, no edema, no deformity, no laceration and no spasm.  Neurological: She is alert and oriented to person, place, and time.  Skin: Skin is warm and dry. No rash noted. No erythema. No pallor.   ECG interpretation by Dr. Conley Rolls and PA-Mani - normal sinus rhythm.  [facility] reading (PRIMARY) by  Dr. Conley Rolls and PA-Mani. Chest - no acute cardiopulmonary process.  Dg Chest 2 View  04/11/2015   CLINICAL DATA:  Chest pain with shortness of breath. Initial encounter.  EXAM: CHEST  2 VIEW  COMPARISON:  None.  FINDINGS: The heart size and mediastinal contours are normal. The lungs are clear. There is no pleural effusion or pneumothorax. No acute osseous findings are identified.  IMPRESSION: No active cardiopulmonary process.   Electronically Signed   By: Carey Bullocks M.D.   On: 04/11/2015 14:15   Results for orders placed or performed in visit on 04/11/15 (from the past 24 hour(s))  POCT urine pregnancy     Status: None   Collection Time: 04/11/15  2:00 PM  Result Value Ref Range   Preg Test, Ur Negative Negative   Assessment and Plan :   1. Atypical chest pain 2. Left-sided thoracic back pain 3. Rib pain on left side 4. Neck pain - Source is likely musculoskeletal in origin. I will print prescription for NSAID and muscle  relaxant, pending troponin levels I will recommend the patient start this for her atypical chest pain, musculoskeletal type pain. - Reviewed worsening symptoms of chest pain, patient is to return to the clinic or call 911.  UPDATE: Troponin I was negative. Called patient to report by voicemail. Will offer patient cardiology referral.  Wallis Bamberg, PA-C [facility] [phone] 04/11/2015 1:13  PM

## 2015-04-11 NOTE — Patient Instructions (Addendum)
Chest Pain (Nonspecific) It is often hard to give a specific diagnosis for the cause of chest pain. There is always a chance that your pain could be related to something serious, such as a heart attack or a blood clot in the lungs. You need to follow up with your health care provider for further evaluation. CAUSES   Heartburn.  Pneumonia or bronchitis.  Anxiety or stress.  Inflammation around your heart (pericarditis) or lung (pleuritis or pleurisy).  A blood clot in the lung.  A collapsed lung (pneumothorax). It can develop suddenly on its own (spontaneous pneumothorax) or from trauma to the chest.  Shingles infection (herpes zoster virus). The chest wall is composed of bones, muscles, and cartilage. Any of these can be the source of the pain.  The bones can be bruised by injury.  The muscles or cartilage can be strained by coughing or overwork.  The cartilage can be affected by inflammation and become sore (costochondritis). DIAGNOSIS  Lab tests or other studies may be needed to find the cause of your pain. Your health care provider may have you take a test called an ambulatory electrocardiogram (ECG). An ECG records your heartbeat patterns over a 24-hour period. You may also have other tests, such as:  Transthoracic echocardiogram (TTE). During echocardiography, sound waves are used to evaluate how blood flows through your heart.  Transesophageal echocardiogram (TEE).  Cardiac monitoring. This allows your health care provider to monitor your heart rate and rhythm in real time.  Holter monitor. This is a portable device that records your heartbeat and can help diagnose heart arrhythmias. It allows your health care provider to track your heart activity for several days, if needed.  Stress tests by exercise or by giving medicine that makes the heart beat faster. TREATMENT   Treatment depends on what may be causing your chest pain. Treatment may include:  Acid blockers for  heartburn.  Anti-inflammatory medicine.  Pain medicine for inflammatory conditions.  Antibiotics if an infection is present.  You may be advised to change lifestyle habits. This includes stopping smoking and avoiding alcohol, caffeine, and chocolate.  You may be advised to keep your head raised (elevated) when sleeping. This reduces the chance of acid going backward from your stomach into your esophagus. Most of the time, nonspecific chest pain will improve within 2-3 days with rest and mild pain medicine.  HOME CARE INSTRUCTIONS   If antibiotics were prescribed, take them as directed. Finish them even if you start to feel better.  For the next few days, avoid physical activities that bring on chest pain. Continue physical activities as directed.  Do not use any tobacco products, including cigarettes, chewing tobacco, or electronic cigarettes.  Avoid drinking alcohol.  Only take medicine as directed by your health care provider.  Follow your health care provider's suggestions for further testing if your chest pain does not go away.  Keep any follow-up appointments you made. If you do not go to an appointment, you could develop lasting (chronic) problems with pain. If there is any problem keeping an appointment, call to reschedule. SEEK MEDICAL CARE IF:   Your chest pain does not go away, even after treatment.  You have a rash with blisters on your chest.  You have a fever. SEEK IMMEDIATE MEDICAL CARE IF:   You have increased chest pain or pain that spreads to your arm, neck, jaw, back, or abdomen.  You have shortness of breath.  You have an increasing cough, or you cough  up blood.  You have severe back or abdominal pain.  You feel nauseous or vomit.  You have severe weakness.  You faint.  You have chills. This is an emergency. Do not wait to see if the pain will go away. Get medical help at once. Call your local emergency services (911 in [place] Do not drive  yourself to the hospital. MAKE SURE YOU:   Understand these instructions.  Will watch your condition.  Will get help right away if you are not doing well or get worse. Document Released: 04/09/2005 Document Revised: 07/05/2013 Document Reviewed: 02/03/2008 [facility] Patient Information 2015 [place], [place]. This information is not intended to replace advice given to you by your health care provider. Make sure you discuss any questions you have with your health care provider.   Meloxicam tablets What is this medicine? MELOXICAM (mel OX i cam) is a non-steroidal anti-inflammatory drug (NSAID). It is used to reduce swelling and to treat pain. It may be used for osteoarthritis, rheumatoid arthritis, or juvenile rheumatoid arthritis. This medicine may be used for other purposes; ask your health care provider or pharmacist if you have questions. COMMON BRAND NAME(S): Mobic What should I tell my health care provider before I take this medicine? They need to know if you have any of these conditions: -asthma -cigarette smoker -coronary artery bypass graft (CABG) surgery within the past 2 weeks -drink more than 3 alcohol-containing drinks a day -heart disease or circulation problems such as heart failure or leg edema (fluid retention) -hemophilia or bleeding problems -high blood pressure -kidney disease -liver disease -stomach bleeding or ulcers -an unusual or allergic reaction to meloxicam, aspirin, other NSAIDs, other medicines, foods, dyes, or preservatives -pregnant or trying to get pregnant -breast-feeding How should I use this medicine? Take this medicine by mouth with a full glass of water. Follow the directions on the prescription label. Take this medicine in an upright or sitting position. If possible take bedtime doses at least 10 minutes before lying down. You can take it with or without food. If it upsets your stomach, take it with food. Take your medicine at regular intervals. Do  not take it more often than directed. A special MedGuide will be given to you by the pharmacist with each prescription and refill. Be sure to read this information carefully each time. Talk to your pediatrician regarding the use of this medicine in children. Special care may be needed. Elderly patients over 24 years old may have a stronger reaction to this medicine and need smaller doses. Overdosage: If you think you have taken too much of this medicine contact a poison control center or emergency room at once. NOTE: This medicine is only for you. Do not share this medicine with others. What if I miss a dose? If you miss a dose, take it as soon as you can. If it is almost time for your next dose, take only that dose. Do not take double or extra doses. What may interact with this medicine? -alcohol -aspirin -cidofovir -diuretics -lithium -medicines for high blood pressure -methotrexate -other drugs for inflammation like ketorolac, ibuprofen, and prednisone -pemetrexed -warfarin This list may not describe all possible interactions. Give your health care provider a list of all the medicines, herbs, non-prescription drugs, or dietary supplements you use. Also tell them if you smoke, drink alcohol, or use illegal drugs. Some items may interact with your medicine. What should I watch for while using this medicine? Tell your doctor or healthcare professional if your  pain does not get better. Talk to your doctor before taking another medicine for pain. Do not treat yourself. This medicine does not prevent heart attack or stroke. If you take aspirin to prevent heart attack or stroke, talk with your doctor or health care professional. Do not take medicines such as ibuprofen and naproxen with this medicine. Side effects such as stomach upset, nausea, or ulcers may be more likely to occur. Many medicines available without a prescription should not be taken with this medicine. What side effects may I  notice from receiving this medicine? Side effects that you should report to your doctor or health care professional as soon as possible: -black or bloody stools, blood in the urine or vomit -blurred vision -chest pain -difficulty breathing or wheezing -nausea or vomiting -skin rash, skin redness, blistering or peeling skin, hives, or itching -slurred speech or weakness on one side of the body -swelling of eyelids, throat, lips -unexplained weight gain or swelling -unusually weak or tired -yellowing of eyes or skin Side effects that usually do not require medical attention (report to your doctor or health care professional if they continue or are bothersome): -constipation or diarrhea -dizziness -gas or heartburn -stomach pain This list may not describe all possible side effects. Call your doctor for medical advice about side effects. You may report side effects to FDA at 1-800-FDA-1088. Where should I keep my medicine? Keep out of the reach of children. Store at room temperature between 15 and 30 degrees C (59 and 86 degrees F). Protect from moisture. Keep container tightly closed. Throw away any unused medicine after the expiration date. NOTE: This sheet is a summary. It may not cover all possible information. If you have questions about this medicine, talk to your doctor, pharmacist, or health care provider.  2015, Elsevier/Gold Standard. (2009-10-22 21:15:42)   Cyclobenzaprine tablets What is this medicine? CYCLOBENZAPRINE (sye kloe BEN za preen) is a muscle relaxer. It is used to treat muscle pain, spasms, and stiffness. This medicine may be used for other purposes; ask your health care provider or pharmacist if you have questions. COMMON BRAND NAME(S): Fexmid, Flexeril What should I tell my health care provider before I take this medicine? They need to know if you have any of these conditions: -heart disease, irregular heartbeat, or previous heart attack -liver  disease -thyroid problem -an unusual or allergic reaction to cyclobenzaprine, tricyclic antidepressants, lactose, other medicines, foods, dyes, or preservatives -pregnant or trying to get pregnant -breast-feeding How should I use this medicine? Take this medicine by mouth with a glass of water. Follow the directions on the prescription label. If this medicine upsets your stomach, take it with food or milk. Take your medicine at regular intervals. Do not take it more often than directed. Talk to your pediatrician regarding the use of this medicine in children. Special care may be needed. Overdosage: If you think you have taken too much of this medicine contact a poison control center or emergency room at once. NOTE: This medicine is only for you. Do not share this medicine with others. What if I miss a dose? If you miss a dose, take it as soon as you can. If it is almost time for your next dose, take only that dose. Do not take double or extra doses. What may interact with this medicine? Do not take this medicine with any of the following medications: -certain medicines for fungal infections like fluconazole, itraconazole, ketoconazole, posaconazole, voriconazole -cisapride -dofetilide -dronedarone -droperidol -flecainide -grepafloxacin -halofantrine -  levomethadyl -MAOIs like Carbex, Eldepryl, Marplan, Nardil, and Parnate -nilotinib -pimozide -probucol -sertindole -thioridazine -ziprasidone This medicine may also interact with the following medications: -abarelix -alcohol -certain medicines for cancer -certain medicines for depression, anxiety, or psychotic disturbances -certain medicines for infection like alfuzosin, chloroquine, clarithromycin, levofloxacin, mefloquine, pentamidine, troleandomycin -certain medicines for an irregular heart beat -certain medicines used for sleep or numbness during surgery or procedure -contrast  dyes -dolasetron -guanethidine -methadone -octreotide -ondansetron -other medicines that prolong the QT interval (cause an abnormal heart rhythm) -palonosetron -phenothiazines like chlorpromazine, mesoridazine, prochlorperazine, thioridazine -tramadol -vardenafil This list may not describe all possible interactions. Give your health care provider a list of all the medicines, herbs, non-prescription drugs, or dietary supplements you use. Also tell them if you smoke, drink alcohol, or use illegal drugs. Some items may interact with your medicine. What should I watch for while using this medicine? Check with your doctor or health care professional if your condition does not improve within 1 to 3 weeks. You may get drowsy or dizzy when you first start taking the medicine or change doses. Do not drive, use machinery, or do anything that may be dangerous until you know how the medicine affects you. Stand or sit up slowly. Your mouth may get dry. Drinking water, chewing sugarless gum, or sucking on hard candy may help. What side effects may I notice from receiving this medicine? Side effects that you should report to your doctor or health care professional as soon as possible: -allergic reactions like skin rash, itching or hives, swelling of the face, lips, or tongue -chest pain -fast heartbeat -hallucinations -seizures -vomiting Side effects that usually do not require medical attention (report to your doctor or health care professional if they continue or are bothersome): -headache This list may not describe all possible side effects. Call your doctor for medical advice about side effects. You may report side effects to FDA at 1-800-FDA-1088. Where should I keep my medicine? Keep out of the reach of children. Store at room temperature between 15 and 30 degrees C (59 and 86 degrees F). Keep container tightly closed. Throw away any unused medicine after the expiration date. NOTE: This sheet is  a summary. It may not cover all possible information. If you have questions about this medicine, talk to your doctor, pharmacist, or health care provider.  2015, Elsevier/Gold Standard. (2013-01-25 12:48:19)

## 2015-04-14 NOTE — Progress Notes (Signed)
Agree with A/P. Dr Masashi Snowdon 

## 2015-04-19 ENCOUNTER — Ambulatory Visit
Admission: RE | Admit: 2015-04-19 | Discharge: 2015-04-19 | Disposition: A | Discharge status: Home / Self Care | Payer: 59 | Source: Ambulatory Visit

## 2015-04-19 DIAGNOSIS — Z1231 Encounter for screening mammogram for malignant neoplasm of breast: Secondary | ICD-10-CM

## 2015-04-19 DIAGNOSIS — Z803 Family history of malignant neoplasm of breast: Secondary | ICD-10-CM

## 2015-04-23 ENCOUNTER — Other Ambulatory Visit: Payer: Self-pay | Admitting: Obstetrics & Gynecology

## 2015-04-23 DIAGNOSIS — R928 Other abnormal and inconclusive findings on diagnostic imaging of breast: Secondary | ICD-10-CM

## 2015-05-01 ENCOUNTER — Ambulatory Visit
Admission: RE | Admit: 2015-05-01 | Discharge: 2015-05-01 | Disposition: A | Discharge status: Home / Self Care | Payer: 59 | Source: Ambulatory Visit | Attending: Obstetrics & Gynecology | Admitting: Obstetrics & Gynecology

## 2015-05-01 DIAGNOSIS — R928 Other abnormal and inconclusive findings on diagnostic imaging of breast: Secondary | ICD-10-CM

## 2015-08-10 ENCOUNTER — Other Ambulatory Visit: Payer: Self-pay

## 2015-08-10 DIAGNOSIS — Z1231 Encounter for screening mammogram for malignant neoplasm of breast: Secondary | ICD-10-CM

## 2015-12-27 ENCOUNTER — Telehealth: Payer: Self-pay | Admitting: Obstetrics and Gynecology

## 2015-12-27 NOTE — Telephone Encounter (Signed)
Called and left a message for patient to call back to schedule a new patient doctor referral with Dr. Edward JollySilva. Dr. Aldona BarWein is the referring doctor (paper referral).

## 2015-12-28 NOTE — Telephone Encounter (Signed)
Called and left a message for patient to call back to schedule a new patient doctor referral. °

## 2016-01-18 ENCOUNTER — Encounter: Payer: Self-pay | Admitting: Obstetrics and Gynecology

## 2016-01-18 ENCOUNTER — Ambulatory Visit (INDEPENDENT_AMBULATORY_CARE_PROVIDER_SITE_OTHER): Payer: 59 | Admitting: Obstetrics and Gynecology

## 2016-01-18 VITALS — BP 118/76 | HR 76 | Resp 16 | Ht 64.5 in | Wt 144.8 lb

## 2016-01-18 DIAGNOSIS — N8110 Cystocele, unspecified: Secondary | ICD-10-CM

## 2016-01-18 DIAGNOSIS — IMO0002 Reserved for concepts with insufficient information to code with codable children: Secondary | ICD-10-CM

## 2016-01-18 DIAGNOSIS — N393 Stress incontinence (female) (male): Secondary | ICD-10-CM

## 2016-01-18 DIAGNOSIS — N816 Rectocele: Secondary | ICD-10-CM

## 2016-01-18 NOTE — Patient Instructions (Signed)
Please call if I can help you further!

## 2016-01-18 NOTE — Progress Notes (Signed)
40 y.o. [id number] Significant Other Caucasian female here for rectocele.  First noticed after gave birth to her daughter in 2012.  Waxes and wanes but feels like it is progressive.  Was actually painful per patient.  Problems with constipation.  Does not splint.  Has hemorrhoids also.  Some fecal soiling once a day.  Is unaware of this, but notes this on a pad.  Some difficulty controlling gas.  Leaks with sneeze and cough or exercise.  Cannot do jumping jacks without leaking.  Would need to change underwear if she did not stop jumping.  Forces urine out at hte end of the void to avoid leaking.  DF - every 4 hours. No urge incontinence.  NF - 1 - 2 times per night.  No enuresis.  No dysuria, hematuria, UTIs, pyelo, or stones.   2 vaginal deliveries.  Had vacuum and forceps with first delivery.  Uncertain if she had 3rd or 4th degree laceration.  Has completed childbearing.   Mother has prolapse issues as well.   Referred by Dr. Aldona BarWein.   PCP:   NA  Patient's last menstrual period was 12/28/2015.           Sexually active: Yes.    The current method of family planning is vasectomy.    Exercising: No.  The patient does not participate in regular exercise at present. Smoker:  no  Health Maintenance: Pap:  03/21/2014 wnl  History of abnormal Pap:  no MMG:  05/01/2015 BIRADS 1 negative  Colonoscopy:  never BMD:   never   TDaP:  2015  Gardasil:   no HIV: never screened  Hep C:not indicated  Screening Labs:  Hb today: pt states she has had labs recently with Dr. Aldona BarWein, Urine today: had with Dr. Aldona BarWein    reports that she quit smoking about 6 years ago. She has never used smokeless tobacco. She reports that she does not drink alcohol or use illicit drugs.  Past Medical History  Diagnosis Date  . Heart palpitations   . Urinary tract infection     Past Surgical History  Procedure Laterality Date  . Tonsillectomy      "3rd tonsil" removed    Current Outpatient  Prescriptions  Medication Sig Dispense Refill  . cyclobenzaprine (FLEXERIL) 10 MG tablet Take 0.5-1 tablets (5-10 mg total) by mouth at bedtime. (Patient not taking: Reported on 01/18/2016) 30 tablet 0  . meloxicam (MOBIC) 15 MG tablet Take 0.5-1 tablets (7.5-15 mg total) by mouth daily. (Patient not taking: Reported on 01/18/2016) 30 tablet 0   No current facility-administered medications for this visit.    Family History  Problem Relation Age of Onset  . Anesthesia problems Neg Hx   . Cancer Mother   . High Cholesterol Brother   . Hypertension Brother   . Cancer Maternal Grandmother   . Diabetes Maternal Grandmother   . High Cholesterol Maternal Grandmother   . Diabetes Maternal Grandfather   . High Cholesterol Maternal Grandfather   . Stroke Maternal Grandfather     ROS:  Pertinent items are noted in HPI.  Otherwise, a comprehensive ROS was negative.  Exam:   BP 118/76 mmHg  Pulse 76  Resp 16  Ht 5' 4.5" (1.638 m)  Wt 144 lb 12.8 oz (65.681 kg)  BMI 24.48 kg/m2  LMP 12/28/2015    General appearance: alert, cooperative and appears stated age Head: Normocephalic, without obvious abnormality, atraumatic Lungs: clear to auscultation bilaterally  Heart: regular rate and rhythm Abdomen:  soft, non-tender; no masses, no organomegaly Extremities: extremities normal, atraumatic, no cyanosis or edema Skin: Skin color, texture, turgor normal. No rashes or lesions Lymph nodes: Cervical, supraclavicular, and axillary nodes normal. No abnormal inguinal nodes palpated Neurologic: Grossly normal  Pelvic: External genitalia:  no lesions              Urethra:  normal appearing urethra with no masses, tenderness or lesions              Bartholins and Skenes: normal                 Vagina: normal appearing vagina with normal color and discharge, no lesions.  First degree cystocele, minimal uterine prolapse, third degree rectocele.              Cervix: no lesions       Bimanual Exam:   Uterus:  normal size, contour, position, consistency, mobility, non-tender              Adnexa: normal adnexa and no mass, fullness, tenderness              Rectal exam: Yes.  .  Confirms.              Anus:  normal sphincter tone, no lesions  Chaperone was present for exam.  Assessment:     Cystocele. Rectocele.  Genuine stress incontinence. Fecal incontinence.   Plan:   Counseled regarding cystocele and rectocele and stress incontinence, and I discussed medical and surgical options for prolapse and incontinence.  ACOG handouts provided. Medical options include observation, physical therapy, pessary use, and metamucil for fecal soiling. Surgical care would likely include an anterior and posterior colporrhaphy with native tissue repair, TVT Exact midurethral sling and cystoscopy.  I do not believe hysterectomy is necessary at this time. Benefits and risks of surgery discussed.   Risks of surgery include but are not limited to bleeding, infection, damage to surrounding organs, ureteral damage, vaginal pain with sexual activity, permanent mesh use which may cause erosion and exposure in the vagina, urethra, bladder or ureters, dyspareunia, slower voiding and urinary retention, possible need for prolonged catheterization, de novo overactive bladder symptoms, reoperation, recurrence of prolapse and incontinence,  DVT, PE, death, and reaction to anesthesia.   I have discussed surgical expectations regarding the procedures and success rates, outcomes, and recovery.     If wishes to proceed with surgical care, will need urodynamic testing done.   If fecal incontinence does not improve significantly with Metamucil, I would recommend consultation with Romie LeveeAlicia Thomas, colon and rectal surgery.  Patient will consider her options and return prn.    After visit summary provided.   ____45___ minutes face to face time of which over 50% was spent in counseling.

## 2016-01-23 ENCOUNTER — Telehealth: Payer: Self-pay

## 2016-01-23 NOTE — Telephone Encounter (Signed)
Copy of office note from 01-18-16 sent to Dr. Annamaria Hellingobert Wein per Dr. Edward JollySilva.

## 2016-02-02 ENCOUNTER — Emergency Department (HOSPITAL_COMMUNITY)
Admission: EM | Admit: 2016-02-02 | Discharge: 2016-02-02 | Disposition: A | Discharge status: Home / Self Care | Payer: No Typology Code available for payment source | Attending: Physician Assistant | Admitting: Physician Assistant

## 2016-02-02 ENCOUNTER — Encounter (HOSPITAL_COMMUNITY): Payer: Self-pay | Admitting: *Deleted

## 2016-02-02 ENCOUNTER — Emergency Department (HOSPITAL_COMMUNITY): Payer: No Typology Code available for payment source

## 2016-02-02 DIAGNOSIS — Y999 Unspecified external cause status: Secondary | ICD-10-CM | POA: Diagnosis not present

## 2016-02-02 DIAGNOSIS — S39012A Strain of muscle, fascia and tendon of lower back, initial encounter: Secondary | ICD-10-CM | POA: Diagnosis not present

## 2016-02-02 DIAGNOSIS — Y939 Activity, unspecified: Secondary | ICD-10-CM | POA: Diagnosis not present

## 2016-02-02 DIAGNOSIS — Y92410 Unspecified street and highway as the place of occurrence of the external cause: Secondary | ICD-10-CM | POA: Diagnosis not present

## 2016-02-02 DIAGNOSIS — S20212A Contusion of left front wall of thorax, initial encounter: Secondary | ICD-10-CM

## 2016-02-02 DIAGNOSIS — S161XXA Strain of muscle, fascia and tendon at neck level, initial encounter: Secondary | ICD-10-CM

## 2016-02-02 DIAGNOSIS — S199XXA Unspecified injury of neck, initial encounter: Secondary | ICD-10-CM | POA: Diagnosis present

## 2016-02-02 DIAGNOSIS — Z87891 Personal history of nicotine dependence: Secondary | ICD-10-CM | POA: Insufficient documentation

## 2016-02-02 LAB — POC URINE PREG, ED: PREG TEST UR: NEGATIVE

## 2016-02-02 MED ORDER — CYCLOBENZAPRINE HCL 10 MG PO TABS: 10.0000 mg | ORAL_TABLET | Freq: Two times a day (BID) | ORAL | Status: DC | PRN

## 2016-02-02 MED ORDER — NAPROXEN 500 MG PO TABS: 500.0000 mg | ORAL_TABLET | Freq: Two times a day (BID) | ORAL | Status: DC

## 2016-02-02 MED ORDER — HYDROCODONE-ACETAMINOPHEN 5-325 MG PO TABS
2.0000 | ORAL_TABLET | Freq: Once | ORAL | Status: AC
  Administered 2016-02-02: 2 via ORAL
  Filled 2016-02-02: qty 2

## 2016-02-02 MED ORDER — HYDROCODONE-ACETAMINOPHEN 5-325 MG PO TABS: 1.0000 | ORAL_TABLET | ORAL | Status: DC | PRN

## 2016-02-02 MED ORDER — CYCLOBENZAPRINE HCL 10 MG PO TABS
5.0000 mg | ORAL_TABLET | Freq: Once | ORAL | Status: AC
  Administered 2016-02-02: 5 mg via ORAL
  Filled 2016-02-02: qty 1
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
## 2016-02-02 NOTE — ED Notes (Signed)
Per Angelique Holm, PA, pregnancy test negative - x-ray aware.

## 2016-02-02 NOTE — ED Notes (Signed)
Returned from xray
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
## 2016-02-05 ENCOUNTER — Ambulatory Visit (INDEPENDENT_AMBULATORY_CARE_PROVIDER_SITE_OTHER): Payer: 59 | Admitting: Obstetrics and Gynecology

## 2016-02-05 ENCOUNTER — Encounter: Payer: Self-pay | Admitting: Obstetrics and Gynecology

## 2016-02-05 ENCOUNTER — Telehealth: Payer: Self-pay | Admitting: Obstetrics and Gynecology

## 2016-02-05 VITALS — BP 110/68 | HR 88 | Wt 142.0 lb

## 2016-02-05 DIAGNOSIS — K5900 Constipation, unspecified: Secondary | ICD-10-CM

## 2016-02-05 DIAGNOSIS — K602 Anal fissure, unspecified: Secondary | ICD-10-CM

## 2016-02-05 DIAGNOSIS — N816 Rectocele: Secondary | ICD-10-CM | POA: Diagnosis not present

## 2016-02-05 MED ORDER — LIDOCAINE 5 % EX OINT: TOPICAL_OINTMENT | CUTANEOUS | 0 refills | Status: DC
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
## 2016-02-05 NOTE — Patient Instructions (Signed)
Anal Fissure, Adult An anal fissure is a small tear or crack in the skin around the anus. Bleeding from a fissure usually stops on its own within a few minutes. However, bleeding will often occur again with each bowel movement until the crack heals. CAUSES This condition may be caused by:  Passing large, hard stool (feces).  Frequent diarrhea.  Constipation.  Inflammatory bowel disease (Crohn disease or ulcerative colitis).  Infections.  Anal sex. SYMPTOMS Symptoms of this condition include:  Bleeding from the rectum.  Small amounts of blood seen on your stool, on toilet paper, or in the toilet after a bowel movement.  Painful bowel movements.  Itching or irritation around the anus. DIAGNOSIS A health care provider may diagnose this condition by closely examining the anal area. An anal fissure can usually be seen with careful inspection. In some cases, a rectal exam may be performed, or a short tube (anoscope) may be used to examine the anal canal. TREATMENT Treatment for this condition may include:  Taking steps to avoid constipation. This may include making changes to your diet, such as increasing your intake of fiber or fluid.  Taking fiber supplements. These supplements can soften your stool to help make bowel movements easier. Your health care provider may also prescribe a stool softener if your stool is often hard.  Taking sitz baths. This may help to heal the tear.  Using medicated creams or ointments. These may be prescribed to lessen discomfort. HOME CARE INSTRUCTIONS Eating and Drinking  Avoid foods that may be constipating, such as bananas and dairy products.  Drink enough fluid to keep your urine clear or pale yellow.  Maintain a diet that is high in fruits, whole grains, and vegetables. General Instructions  Keep the anal area as clean and dry as possible.  Take sitz baths as told by your health care provider. Do not use soap in the sitz baths.  Take  over-the-counter and prescription medicines only as told by your health care provider.  Use creams or ointments only as told by your health care provider.  Keep all follow-up visits as told by your health care provider. This is important. SEEK MEDICAL CARE IF:  You have more bleeding.  You have a fever.  You have diarrhea that is mixed with blood.  You continue to have pain.  Your problem is getting worse rather than better.   This information is not intended to replace advice given to you by your health care provider. Make sure you discuss any questions you have with your health care provider.   Document Released: 06/30/2005 Document Revised: 03/21/2015 Document Reviewed: 09/25/2014 Elsevier Interactive Patient Education 2016 [employer].   Start taking senna, 1 tablet now, if no BM by bedtime you can take another. Can take up to 2 tabs 2 x a day. Ideally you want to have 2 soft BM's a day. If you take too much you will have diarrhea.

## 2016-02-08 ENCOUNTER — Telehealth: Payer: Self-pay | Admitting: Obstetrics and Gynecology
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
## 2016-02-08 NOTE — Telephone Encounter (Signed)
Patient has an appointment  02/15/16 with Dr.Jertson (10 day fu)and is asking to reschedule to Dr.Silva due to her work schedule. Patient is only available on Friday's and has a conflict with 02/15/16.

## 2016-02-15 ENCOUNTER — Ambulatory Visit: Payer: 59 | Admitting: Obstetrics and Gynecology

## 2016-02-15 ENCOUNTER — Ambulatory Visit (INDEPENDENT_AMBULATORY_CARE_PROVIDER_SITE_OTHER): Payer: 59 | Admitting: Obstetrics and Gynecology

## 2016-02-15 VITALS — BP 110/70 | HR 72 | Resp 16 | Ht 64.5 in | Wt 140.4 lb

## 2016-02-15 DIAGNOSIS — K5900 Constipation, unspecified: Secondary | ICD-10-CM | POA: Diagnosis not present

## 2016-02-15 DIAGNOSIS — K602 Anal fissure, unspecified: Secondary | ICD-10-CM | POA: Diagnosis not present

## 2016-02-15 DIAGNOSIS — N816 Rectocele: Secondary | ICD-10-CM | POA: Diagnosis not present

## 2016-02-15 NOTE — Patient Instructions (Signed)
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
## 2016-02-18 ENCOUNTER — Encounter: Payer: Self-pay | Admitting: Obstetrics and Gynecology

## 2016-03-20 ENCOUNTER — Encounter: Payer: Self-pay | Admitting: Neurology

## 2016-03-20 ENCOUNTER — Ambulatory Visit (INDEPENDENT_AMBULATORY_CARE_PROVIDER_SITE_OTHER): Payer: 59 | Admitting: Neurology

## 2016-03-20 VITALS — BP 112/76 | HR 72 | Ht 64.0 in | Wt 140.0 lb

## 2016-03-20 DIAGNOSIS — G44319 Acute post-traumatic headache, not intractable: Secondary | ICD-10-CM | POA: Diagnosis not present

## 2016-03-20 NOTE — Patient Instructions (Signed)
Your exam looks fine.  You are clinically getting better as expected, so I don't think imaging of the head is needed. 1.  Do not use hydrocodone to treat headache.  Instead, try the naproxen 500mg .   2.  I think you should continue to get better with physical therapy.  Follow up in 4 months.  If you are still doing well around that time, you may call and cancel.  Contact us with questions or concerns.
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
## 2016-03-20 NOTE — Progress Notes (Signed)
Chart forwarded.  

## 2016-03-23 ENCOUNTER — Encounter: Payer: Self-pay | Admitting: Obstetrics and Gynecology

## 2016-03-26 ENCOUNTER — Telehealth: Payer: Self-pay | Admitting: *Deleted

## 2016-03-26 NOTE — Telephone Encounter (Signed)
Left message to call Noreene LarssonJill @ [phone].   Non-Urgent Medical Question  Message [phone]  [facility] To Romualdo BolkJill Evelyn Jertson, MD Sent 03/23/2016 11:22 AM  I have problems with stomach after eating for the last week. Right after eating regular sized portions my stomach (above the belly button)will extend forward like a 4 month pregnant belly. The stomach is then firm and is a companied by nausea. Stomach will flatten out after using the restroom the following morning. I have to take aids for defecation at that point. Nibbling on food or eating extremely small amounts keeps the extension somewhat under control. Is this prolapse related or should I have this checked out. Do I need a referal to a specialist, if so? Do I need imaging?

## 2016-03-26 NOTE — Telephone Encounter (Signed)
Left message to call Kristi Haynes @ [phone]. Please see telephone encounter dated 03/25/16.

## 2016-04-21 ENCOUNTER — Ambulatory Visit
Admission: RE | Admit: 2016-04-21 | Discharge: 2016-04-21 | Disposition: A | Discharge status: Home / Self Care | Payer: 59 | Source: Ambulatory Visit

## 2016-04-21 DIAGNOSIS — Z1231 Encounter for screening mammogram for malignant neoplasm of breast: Secondary | ICD-10-CM

## 2016-05-12 ENCOUNTER — Other Ambulatory Visit: Payer: Self-pay | Admitting: Obstetrics & Gynecology

## 2016-05-13 LAB — CYTOLOGY - PAP

## 2016-06-26 ENCOUNTER — Telehealth: Payer: Self-pay | Admitting: Obstetrics and Gynecology

## 2016-06-26 NOTE — Telephone Encounter (Signed)
Patient calling to find out what her out of pocket expenses would be if she decided to have the bladder and/or rectal prolapse procedures done that was discussed when she came in to see dr Edward JollySilva. Ok to leave a detailed message if she does not answer.

## 2016-06-27 NOTE — Telephone Encounter (Signed)
Returned call to patient. Advised patient order to provide benefits for potential surgeries, she will need to consult with Dr Edward JollySilva, in order to determine any applicable surgery plan. Patient is agreeable and is scheduled for an appointment with Dr Edward JollySilva on 07/11/16. Patient is aware of date, arrival time and cancellation policy. Patient had not further questions.  Routing to Dr Edward JollySilva  cc: Billie RuddySally Yeakley

## 2016-06-27 NOTE — Telephone Encounter (Signed)
Thank you for facilitating the appointment to reassess surgical needs.  I am closing the encounter.   Cc- Billie RuddySally Yeakley

## 2016-07-11 ENCOUNTER — Ambulatory Visit: Payer: 59 | Admitting: Obstetrics and Gynecology

## 2016-07-18 ENCOUNTER — Ambulatory Visit: Payer: 59 | Admitting: Obstetrics and Gynecology

## 2016-07-21 ENCOUNTER — Ambulatory Visit: Payer: 59 | Admitting: Neurology

## 2016-07-25 ENCOUNTER — Encounter: Payer: Self-pay | Admitting: Obstetrics and Gynecology

## 2016-07-25 ENCOUNTER — Ambulatory Visit (INDEPENDENT_AMBULATORY_CARE_PROVIDER_SITE_OTHER): Payer: 59 | Admitting: Obstetrics and Gynecology

## 2016-07-25 VITALS — BP 110/78 | HR 68 | Resp 16 | Ht 65.0 in | Wt 149.0 lb

## 2016-07-25 DIAGNOSIS — N813 Complete uterovaginal prolapse: Secondary | ICD-10-CM | POA: Diagnosis not present

## 2016-07-25 DIAGNOSIS — N393 Stress incontinence (female) (male): Secondary | ICD-10-CM | POA: Diagnosis not present

## 2016-07-25 NOTE — Patient Instructions (Addendum)
Your prolapse is about a first to second degree cystocele, first degree uterine prolapse, and a third degree rectocele.  We are considering for you a laparoscopically assisted vaginal hysterectomy with removal of the fallopian tubes, a culdoplasty to support the top of the vagina once the uterus is removed, an anterior and posterior colporrhaphy (bladder and rectal tack), and perhaps a TVT midurethral sling and cystoscopy (looking in the bladder at the end of the procedure).

## 2016-07-25 NOTE — Progress Notes (Signed)
GYNECOLOGY  VISIT   HPI: 41 y.o.   Divorced  Caucasian  [place]   [phone] with Patient's last menstrual period was 07/20/2016.   here to discuss surgery options for bladder/rectal prolapse. Struggling with her pelvic floor symptoms.  Has known prolapse:  First degree cystocele.  Minimal uterine prolapse.  Third degree rectocele.   BMs ok 1/3 of the time, 1/3 time cannot go well, 1/3 time OK. Uses Senna.  Has nausea with constipation.   Voiding often and with urge.  Sometimes cannot void completely.  Leakage of urine with cough and sneeze, not laugh.  Sexual intercourse is now sometimes painful.  Menses are usually monthly.  Skipped for 3 months during the summer.  Has a prescription for ?provera if this occurs again.   Mother has prolapse again.   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  GYNECOLOGIC HISTORY: Patient's last menstrual period was 07/20/2016. Contraception:  vasectomy Menopausal hormone therapy:  n/a Last mammogram:  04/21/16 BIRADS 1 negative Last pap smear:   05/12/16 - Normal - Dr. Franklyn Lorobert Wein's office        OB History    Gravida Para Term Preterm AB Living   2 2 2     2    SAB TAB Ectopic Multiple Live Births           2         Patient Active Problem List   Diagnosis Date Noted  . Left-sided thoracic back pain 04/11/2015  . Atypical chest pain 04/11/2015    Past Medical History:  Diagnosis Date  . Heart palpitations   . Urinary tract infection     Past Surgical History:  Procedure Laterality Date  . TONSILLECTOMY     "3rd tonsil" removed    No current outpatient prescriptions on file.   No current facility-administered medications for this visit.      ALLERGIES: Patient has no known allergies.  Family History  Problem Relation Age of Onset  . Cancer Mother   . High Cholesterol Brother   . Hypertension Brother   . Cancer Maternal Grandmother   . Diabetes Maternal Grandmother   . High Cholesterol Maternal Grandmother   .  Diabetes Maternal Grandfather   . High Cholesterol Maternal Grandfather   . Stroke Maternal Grandfather   . Anesthesia problems Neg Hx     Social History   Social History  . Marital status: Divorced    Spouse name: N/A  . Number of children: N/A  . Years of education: N/A   Occupational History  . Not on file.   Social History Main Topics  . Smoking status: Former Smoker    Quit date: 10/05/2009  . Smokeless tobacco: Never Used  . Alcohol use Yes     Comment: Occasional  . Drug use: No  . Sexual activity: Yes    Birth control/ protection: Surgical     Comment: vasectomy   Other Topics Concern  . Not on file   Social History Narrative  . No narrative on file    ROS:  Pertinent items are noted in HPI.  PHYSICAL EXAMINATION:    BP 110/78 (BP Location: Right Arm, Patient Position: Sitting, Cuff Size: Normal)   Pulse 68   Resp 16   Ht 5\' 5"  (1.651 m)   Wt 149 lb (67.6 kg)   LMP 07/20/2016   BMI 24.79 kg/m     General appearance: alert, cooperative and appears stated age.Glori Bickers. Tearful with conversation.  Pelvic: External genitalia:  no lesions              Urethra:  normal appearing urethra with no masses, tenderness or lesions              Bartholins and Skenes: normal                 Vagina: normal appearing vagina with normal color and discharge, no lesions.  First to second degree cystocele, first degree uterine prolapse, and third degree rectocele.              Cervix: no lesions                Bimanual Exam:  Uterus:  normal size, contour, position, consistency, mobility, non-tender              Adnexa: no mass, fullness, tenderness    Chaperone was present for exam.  ASSESSMENT  Incomplete uterovaginal prolapse.  Genuine stress incontinence.  PLAN  We discussed her prolapse and that there has been some progression of her prolapse and that surgical repair could include a laparoscopically assisted vaginal hysterectomy with bilateral salpingectomy,  McCall's culdoplasty, anterior and posterior colporrhaphy, and possible midurethral sling/cystoscopy.  She may choose to proceed with just the anterior and posterior colporrhaphy with possible midurethral sling/cystoscopy. Urodynamics would need to precede this.  Procedure and rationale explained.  She is asking for a precert of these procedures.   An After Visit Summary was printed and given to the patient.  ___25___ minutes face to face time of which over 50% was spent in counseling.

## 2016-08-18 ENCOUNTER — Telehealth: Payer: Self-pay | Admitting: *Deleted

## 2016-08-18 NOTE — Telephone Encounter (Signed)
Patient left voicemail returning your call.  States she is waiting on the treatment cost estimate before she schedules,  Would like for you or someone to call her with the estimates.

## 2016-08-18 NOTE — Telephone Encounter (Signed)
Follow-up call to patient regarding plans for surgery. Left message to call back.

## 2016-08-19 NOTE — Telephone Encounter (Signed)
Called patient to review benefits for a recommended procedure. Left Voicemail requesting a call back. °

## 2016-08-20 NOTE — Telephone Encounter (Signed)
Called patient to review benefits for a recommended surgical procedure. Left Voicemail requesting a call back. °

## 2016-09-08 NOTE — Telephone Encounter (Signed)
Third call placed to patient to review benefit information for recommended surgical procedure. Left message requesting a return call.  Routing to [employer] Yeakley

## 2016-09-11 NOTE — Telephone Encounter (Signed)
Call to patient. Per ROI can leave message. Voice mail confirms "Kristi Haynes." Left message calling to provide benefit information and get update on scheduling plans. Left message to call back.

## 2016-09-17 NOTE — Telephone Encounter (Signed)
No patient response. Routing to provider for final review.

## 2016-09-17 NOTE — Telephone Encounter (Signed)
Ok to close encounter. 

## 2016-09-18 ENCOUNTER — Ambulatory Visit (INDEPENDENT_AMBULATORY_CARE_PROVIDER_SITE_OTHER): Payer: 59 | Admitting: Emergency Medicine

## 2016-09-18 VITALS — BP 120/72 | HR 60 | Temp 97.8°F | Resp 16

## 2016-09-18 DIAGNOSIS — R198 Other specified symptoms and signs involving the digestive system and abdomen: Secondary | ICD-10-CM

## 2016-09-18 MED ORDER — CEFADROXIL 500 MG PO CAPS: 500.0000 mg | ORAL_CAPSULE | Freq: Two times a day (BID) | ORAL | 0 refills | Status: AC

## 2016-09-18 NOTE — Patient Instructions (Signed)
     IF you received an x-ray today, you will receive an invoice from [facility]. Please contact [facility] at [phone] with questions or concerns regarding your invoice.   IF you received labwork today, you will receive an invoice from [facility]. Please contact [facility] at [phone] with questions or concerns regarding your invoice.   Our billing staff will not be able to assist you with questions regarding bills from these companies.  You will be contacted with the lab results as soon as they are available. The fastest way to get your results is to activate your My Chart account. Instructions are located on the last page of this paperwork. If you have not heard from us regarding the results in 2 weeks, please contact this office.     

## 2016-09-18 NOTE — Telephone Encounter (Signed)
Encounter closed.  CC: Kristi DingwallSuzy Haynes.

## 2016-09-18 NOTE — Progress Notes (Signed)
[facility] 41 [facility]   Chief Complaint  Patient presents with  . naval drainage x 1 day    HISTORY OF PRESENT ILLNESS: This is a 41 y.o. female complaining of clear drainage from umbilicus since yesterday. Denies pain, fever, chills, bleeding, or any other significant symptoms.  HPI   Prior to Admission medications   Medication Sig Start Date End Date Taking? Authorizing Provider  cefadroxil (DURICEF) 500 MG capsule Take 1 capsule (500 mg total) by mouth 2 (two) times daily. 09/18/16 09/25/16  Georgina QuintMiguel Jose Maleko Greulich, MD    No Known Allergies  Patient Active Problem List   Diagnosis Date Noted  . Left-sided thoracic back pain 04/11/2015  . Atypical chest pain 04/11/2015    Past Medical History:  Diagnosis Date  . Heart palpitations   . Urinary tract infection     Past Surgical History:  Procedure Laterality Date  . TONSILLECTOMY     "3rd tonsil" removed    Social History   Social History  . Marital status: Divorced    Spouse name: N/A  . Number of children: N/A  . Years of education: N/A   Occupational History  . Not on file.   Social History Main Topics  . Smoking status: Former Smoker    Quit date: 10/05/2009  . Smokeless tobacco: Never Used  . Alcohol use Yes     Comment: Occasional  . Drug use: No  . Sexual activity: Yes    Birth control/ protection: Surgical     Comment: vasectomy   Other Topics Concern  . Not on file   Social History Narrative  . No narrative on file    Family History  Problem Relation Age of Onset  . Cancer Mother   . High Cholesterol Brother   . Hypertension Brother   . Cancer Maternal Grandmother   . Diabetes Maternal Grandmother   . High Cholesterol Maternal Grandmother   . Diabetes Maternal Grandfather   . High Cholesterol Maternal Grandfather   . Stroke Maternal Grandfather   . Anesthesia problems Neg Hx      Review of Systems  Constitutional: Negative for chills and fever.  HENT: Negative for sore  throat.   Respiratory: Negative for shortness of breath.   Cardiovascular: Negative for chest pain and leg swelling.  Gastrointestinal: Negative for abdominal pain, diarrhea, nausea and vomiting.  Genitourinary: Negative for dysuria, frequency and hematuria.  Musculoskeletal: Negative for myalgias.  Skin: Negative for rash.  Neurological: Negative for dizziness and headaches.  All other systems reviewed and are negative.   Vitals:   09/18/16 1204  BP: 120/72  Pulse: 60  Resp: 16  Temp: 97.8 F (36.6 C)    Physical Exam  Constitutional: She is oriented to person, place, and time. She appears well-developed and well-nourished.  HENT:  Head: Normocephalic and atraumatic.  Eyes: Conjunctivae and EOM are normal. Pupils are equal, round, and reactive to light.  Neck: Normal range of motion. Neck supple.  Cardiovascular: Normal rate and regular rhythm.   Pulmonary/Chest: Effort normal.  Abdominal: Soft. Bowel sounds are normal.  Musculoskeletal: Normal range of motion.  Neurological: She is alert and oriented to person, place, and time. No sensory deficit. She exhibits normal muscle tone.  Skin: Skin is warm and dry. Capillary refill takes less than 2 seconds.  Umbilicus: mild erythema but no discharge at this time, no tenderness or masses felt; able to palpate area without eliciting tenderness.  Psychiatric: She has a normal mood and affect. Her  behavior is normal.  Vitals reviewed.    ASSESSMENT & PLAN: Kristi Haynes was seen today for naval drainage x 1 day.  Diagnoses and all orders for this visit:  Umbilical discharge  Other orders -     cefadroxil (DURICEF) 500 MG capsule; Take 1 capsule (500 mg total) by mouth 2 (two) times daily.      Edwina Barth, MD [facility]

## 2017-01-23 ENCOUNTER — Ambulatory Visit (INDEPENDENT_AMBULATORY_CARE_PROVIDER_SITE_OTHER): Payer: 59 | Admitting: Obstetrics and Gynecology

## 2017-01-23 ENCOUNTER — Encounter: Payer: Self-pay | Admitting: Obstetrics and Gynecology

## 2017-01-23 VITALS — BP 110/70 | HR 72 | Resp 16 | Ht 65.0 in | Wt 151.0 lb

## 2017-01-23 DIAGNOSIS — N812 Incomplete uterovaginal prolapse: Secondary | ICD-10-CM

## 2017-01-23 DIAGNOSIS — N393 Stress incontinence (female) (male): Secondary | ICD-10-CM

## 2017-01-23 NOTE — Progress Notes (Signed)
41 y.o. [id number] Single Caucasian female here for prolapse recheck.   Has incomplete uterovaginal prolapse.  First to second degree cystocele, first degree uterine prolapse and third degree rectocele.  Feels like her bladder is not emptying as well.  Voiding every hour.  Urinary incontinence with exercising like jumping jacks.  Can leak with sneeze and cough if she does not Kegel.  BMs are alternating from constipation to diarrhea.  Uses Senna only if she is really constipated.   Can feel some sharp pain with intercourse.   Feels the prolapse increases toward the end of the day.   GYN:  Dr. Aldona Bar.    Patient's last menstrual period was 12/27/2016.           Sexually active: Yes.    The current method of family planning is vasectomy.    Smoker:  no  Health Maintenance: Pap:  05/12/16 Pap smear negative  03/21/2014 wnl  History of abnormal Pap:  no MMG:  04/21/16 BIRADS 1 negative/density b Colonoscopy:  n/a BMD:   n/a TDaP:  2015 Screening Labs: Dr. Annamaria Helling takes care of labs     reports that she quit smoking about 7 years ago. She has never used smokeless tobacco. She reports that she drinks alcohol. She reports that she does not use drugs.  Past Medical History:  Diagnosis Date  . Heart palpitations   . Urinary tract infection     Past Surgical History:  Procedure Laterality Date  . TONSILLECTOMY     "3rd tonsil" removed    Current Outpatient Prescriptions  Medication Sig Dispense Refill  . Multiple Vitamins-Minerals (MULTIVITAMIN ADULT PO) multivitamin     No current facility-administered medications for this visit.     Family History  Problem Relation Age of Onset  . Cancer Mother   . High Cholesterol Brother   . Hypertension Brother   . Cancer Maternal Grandmother   . Diabetes Maternal Grandmother   . High Cholesterol Maternal Grandmother   . Diabetes Maternal Grandfather   . High Cholesterol Maternal Grandfather   . Stroke Maternal Grandfather    . Anesthesia problems Neg Hx     ROS:  Pertinent items are noted in HPI.  Otherwise, a comprehensive ROS was negative.  Exam:   BP 110/70 (BP Location: Right Arm, Patient Position: Sitting, Cuff Size: Normal)   Pulse 72   Resp 16   Ht 5\' 5"  (1.651 m)   Wt 151 lb (68.5 kg)   LMP 12/27/2016   BMI 25.13 kg/m     General appearance: alert, cooperative and appears stated age   Pelvic: External genitalia:  no lesions              Urethra:  normal appearing urethra with no masses, tenderness or lesions              Bartholins and Skenes: normal                 Vagina: normal appearing vagina with normal color and discharge, no lesions              Cervix: no lesions              Bimanual Exam:  Uterus:  normal size, contour, position, consistency, mobility, non-tender.  Almost second degree cystocele, first degree uterine prolapse, third degree rectocele.               Adnexa: no mass, fullness, tenderness  Chaperone was present for exam.  Assessment:    Incomplete uterovaginal prolapse.  Stress incontinence.  Plan:   Discussed uterovaginal prolapse which is relatively stable. Discussed observation, pessary care, and LAVH/bilateral salpingectomy, anterior and posterior colporrhaphy, potential TVT.   Patient chooses observational therapy.  FU with Dr. Aldona BarWein for annual exam in the fall.  FU here prn.   ____25___ minutes face to face time of which over 50% was spent in counseling.     After visit summary provided.

## 2017-01-23 NOTE — Patient Instructions (Signed)
Call if you need anything further from me.  Please see Dr. Aldona BarWein for your check up this Fall!

## 2017-03-31 ENCOUNTER — Other Ambulatory Visit: Payer: Self-pay | Admitting: Obstetrics & Gynecology

## 2017-03-31 DIAGNOSIS — Z1231 Encounter for screening mammogram for malignant neoplasm of breast: Secondary | ICD-10-CM

## 2017-04-24 ENCOUNTER — Ambulatory Visit: Payer: 59

## 2017-04-27 ENCOUNTER — Other Ambulatory Visit: Payer: Self-pay | Admitting: Obstetrics & Gynecology

## 2017-04-27 DIAGNOSIS — Z1231 Encounter for screening mammogram for malignant neoplasm of breast: Secondary | ICD-10-CM

## 2017-05-15 ENCOUNTER — Ambulatory Visit
Admission: RE | Admit: 2017-05-15 | Discharge: 2017-05-15 | Disposition: A | Discharge status: Home / Self Care | Payer: 59 | Source: Ambulatory Visit | Attending: Obstetrics & Gynecology | Admitting: Obstetrics & Gynecology

## 2017-05-15 DIAGNOSIS — Z1231 Encounter for screening mammogram for malignant neoplasm of breast: Secondary | ICD-10-CM

## 2018-04-23 ENCOUNTER — Other Ambulatory Visit: Payer: Self-pay | Admitting: Obstetrics & Gynecology

## 2018-04-23 DIAGNOSIS — Z1231 Encounter for screening mammogram for malignant neoplasm of breast: Secondary | ICD-10-CM

## 2018-05-31 ENCOUNTER — Ambulatory Visit
Admission: RE | Admit: 2018-05-31 | Discharge: 2018-05-31 | Disposition: A | Discharge status: Home / Self Care | Payer: 59 | Source: Ambulatory Visit | Attending: Obstetrics & Gynecology | Admitting: Obstetrics & Gynecology

## 2018-05-31 DIAGNOSIS — Z1231 Encounter for screening mammogram for malignant neoplasm of breast: Secondary | ICD-10-CM

## 2018-08-21 ENCOUNTER — Other Ambulatory Visit: Payer: Self-pay

## 2018-08-21 ENCOUNTER — Ambulatory Visit: Payer: 59 | Admitting: Family Medicine

## 2018-08-21 ENCOUNTER — Encounter: Payer: Self-pay | Admitting: Family Medicine

## 2018-08-21 VITALS — BP 117/72 | HR 91 | Temp 98.6°F | Resp 16 | Ht 65.0 in | Wt 154.0 lb

## 2018-08-21 DIAGNOSIS — J029 Acute pharyngitis, unspecified: Secondary | ICD-10-CM

## 2018-08-21 DIAGNOSIS — Z09 Encounter for follow-up examination after completed treatment for conditions other than malignant neoplasm: Secondary | ICD-10-CM

## 2018-08-21 DIAGNOSIS — B950 Streptococcus, group A, as the cause of diseases classified elsewhere: Secondary | ICD-10-CM | POA: Diagnosis not present

## 2018-08-21 DIAGNOSIS — R5383 Other fatigue: Secondary | ICD-10-CM

## 2018-08-21 DIAGNOSIS — J020 Streptococcal pharyngitis: Secondary | ICD-10-CM

## 2018-08-21 LAB — POCT RAPID STREP A (OFFICE): Rapid Strep A Screen: POSITIVE — AB

## 2018-08-21 LAB — POCT INFLUENZA A/B
Influenza A, POC: NEGATIVE
Influenza B, POC: NEGATIVE

## 2018-08-21 MED ORDER — AMOXICILLIN-POT CLAVULANATE 875-125 MG PO TABS: 1.0000 | ORAL_TABLET | Freq: Two times a day (BID) | ORAL | 0 refills | Status: AC

## 2018-08-21 NOTE — Progress Notes (Signed)
Established Patient Office Visit  Subjective:  Patient ID: Kristi Haynes, female    DOB: [birth year]  Age: 43 y.o. MRN: [id number]  CC:  Chief Complaint  Patient presents with  . Sore Throat    x 2 days with chills and bodyaches     HPI Kristi Haynes is a 43 year old female who presents for Sick Visit today.   Past Medical History:  Diagnosis Date  . Heart palpitations   . Urinary tract infection    Current Status: Since her last office visit, she has c/o sore throat 2 nights ago. She began to have chills, night sweats, mucus build up in throat, and body aches; hard to swallow foods. She denies fevers, recent infections, and weight loss. She is accompanied today by her daughter.   She has not had any visual changes, dizziness, and falls. No chest pain, heart palpitations, cough and shortness of breath reported. She states that she has had mild nausea. No reports of GI problems such as vomiting, diarrhea, and constipation. She has no reports of blood in stools, dysuria and hematuria. No depression or anxiety reported. She denies pain today.   Past Surgical History:  Procedure Laterality Date  . TONSILLECTOMY     "3rd tonsil" removed    Family History  Problem Relation Age of Onset  . Cancer Mother   . Breast cancer Mother 25  . High Cholesterol Brother   . Hypertension Brother   . Cancer Maternal Grandmother   . Diabetes Maternal Grandmother   . High Cholesterol Maternal Grandmother   . Diabetes Maternal Grandfather   . High Cholesterol Maternal Grandfather   . Stroke Maternal Grandfather   . Anesthesia problems Neg Hx     Social History   Socioeconomic History  . Marital status: Married    Spouse name: Not on file  . Number of children: Not on file  . Years of education: Not on file  . Highest education level: Not on file  Occupational History  . Not on file  Social Needs  . Financial resource strain: Not on file  . Food insecurity:    Worry: Not  on file    Inability: Not on file  . Transportation needs:    Medical: Not on file    Non-medical: Not on file  Tobacco Use  . Smoking status: Former Smoker    Last attempt to quit: 10/05/2009    Years since quitting: 8.8  . Smokeless tobacco: Never Used  Substance and Sexual Activity  . Alcohol use: Yes    Comment: Occasional  . Drug use: No  . Sexual activity: Yes    Birth control/protection: Surgical    Comment: vasectomy  Lifestyle  . Physical activity:    Days per week: Not on file    Minutes per session: Not on file  . Stress: Not on file  Relationships  . Social connections:    Talks on phone: Not on file    Gets together: Not on file    Attends religious service: Not on file    Active member of club or organization: Not on file    Attends meetings of clubs or organizations: Not on file    Relationship status: Not on file  . Intimate partner violence:    Fear of current or ex partner: Not on file    Emotionally abused: Not on file    Physically abused: Not on file    Forced sexual activity: Not on file  Other Topics Concern  . Not on file  Social History Narrative  . Not on file    Outpatient Medications Prior to Visit  Medication Sig Dispense Refill  . Multiple Vitamins-Minerals (MULTIVITAMIN ADULT PO) multivitamin     No facility-administered medications prior to visit.     No Known Allergies  ROS Review of Systems  Constitutional: Positive for chills.  HENT: Positive for sore throat.   Eyes: Negative.   Respiratory: Negative.  Cough: mild.   Cardiovascular: Negative.   Gastrointestinal: Negative.   Endocrine: Negative.   Genitourinary: Negative.   Musculoskeletal: Negative.        Body Aches  Skin: Negative.   Allergic/Immunologic: Negative.   Neurological: Negative.   Hematological: Negative.   Psychiatric/Behavioral: Negative.    Objective:    Physical Exam  Constitutional: She is oriented to person, place, and time. She appears  well-developed and well-nourished.  HENT:  Head: Normocephalic and atraumatic.  Mouth/Throat:    Swollen, erythema throat.   Eyes: Conjunctivae are normal.  Neck: Normal range of motion. Neck supple.  Cardiovascular: Normal rate, regular rhythm, normal heart sounds and intact distal pulses.  Pulmonary/Chest: Effort normal and breath sounds normal.  Abdominal: Soft. Bowel sounds are normal.  Musculoskeletal: Normal range of motion.  Neurological: She is alert and oriented to person, place, and time. She has normal reflexes.  Skin: Skin is warm and dry.  Psychiatric: She has a normal mood and affect. Her behavior is normal. Judgment and thought content normal.  Nursing note and vitals reviewed.   BP 117/72   Pulse 91   Temp 98.6 F (37 C) (Oral)   Resp 16   Ht 5\' 5"  (1.651 m)   Wt 154 lb (69.9 kg)   SpO2 99%   BMI 25.63 kg/m  Wt Readings from Last 3 Encounters:  08/21/18 154 lb (69.9 kg)  01/23/17 151 lb (68.5 kg)  07/25/16 149 lb (67.6 kg)     Health Maintenance Due  Topic Date Due  . HIV Screening  05/30/1989    There are no preventive care reminders to display for this patient.  No results found for: TSH Lab Results  Component Value Date   WBC 8.4 06/19/2011   HGB 11.5 (L) 06/19/2011   HCT 34.1 (L) 06/19/2011   MCV 89.7 06/19/2011   PLT 319 06/19/2011   Lab Results  Component Value Date   NA 138 04/11/2015   K 4.5 04/11/2015   CO2 28 04/11/2015   GLUCOSE 86 04/11/2015   BUN 10 04/11/2015   CREATININE 0.51 04/11/2015   BILITOT 0.5 04/11/2015   ALKPHOS 44 04/11/2015   AST 15 04/11/2015   ALT 6 04/11/2015   PROT 7.4 04/11/2015   ALBUMIN 4.5 04/11/2015   CALCIUM 9.6 04/11/2015   Lab Results  Component Value Date   CHOL 189 04/11/2015   Lab Results  Component Value Date   HDL 70 04/11/2015   Lab Results  Component Value Date   LDLCALC 107 04/11/2015   Lab Results  Component Value Date   TRIG 61 04/11/2015   Lab Results  Component Value  Date   CHOLHDL 2.7 04/11/2015   No results found for: HGBA1C  Assessment & Plan:   1. Streptococcal infection group A She is positive for Strep A. We will initiate Augmentin today.  - amoxicillin-clavulanate (AUGMENTIN) 875-125 MG tablet; Take 1 tablet by mouth 2 (two) times daily for 7 days.  Dispense: 14 tablet; Refill: 0  2. Fatigue, unspecified  type - POCT Influenza A/B  3. Sore throat Gargle with warm water, use lozenges/drops, throat sprays, gargles, and teas are available for relief of pain related to pharyngitis. In addition, adjusting the temperature and texture of beverages and foods helps to relieve the pain of swallowing, which can limit hydration and caloric intake during throat pain. - POCT rapid strep A  4. Follow up She will follow up as needed.   Meds ordered this encounter  Medications  . amoxicillin-clavulanate (AUGMENTIN) 875-125 MG tablet    Sig: Take 1 tablet by mouth 2 (two) times daily for 7 days.    Dispense:  14 tablet    Refill:  0   Raliegh Ip,  MSN, FNP-C [facility] [address] [place], [place] [address] [phone]   Problem List Items Addressed This Visit    None    Visit Diagnoses    Streptococcal infection group A    -  Primary   Relevant Medications   amoxicillin-clavulanate (AUGMENTIN) 875-125 MG tablet   Fatigue, unspecified type       Relevant Orders   POCT Influenza A/B (Completed)   Sore throat       Relevant Orders   POCT rapid strep A (Completed)   Follow up          Meds ordered this encounter  Medications  . amoxicillin-clavulanate (AUGMENTIN) 875-125 MG tablet    Sig: Take 1 tablet by mouth 2 (two) times daily for 7 days.    Dispense:  14 tablet    Refill:  0    Follow-up: No follow-ups on file.    Kallie Locks, FNP

## 2018-08-21 NOTE — Patient Instructions (Addendum)
If you have lab work done today you will be contacted with your lab results within the next 2 weeks.  If you have not heard from [place] then please contact us. The fastest way to get your results is to register for My Chart.   IF you received an x-ray today, you will receive an invoice from [facility]. Please contact [facility] at [phone] with questions or concerns regarding your invoice.   IF you received labwork today, you will receive an invoice from [facility]. Please contact [facility] at [phone] with questions or concerns regarding your invoice.   Our billing staff will not be able to assist you with questions regarding bills from these companies.  You will be contacted with the lab results as soon as they are available. The fastest way to get your results is to activate your My Chart account. Instructions are located on the last page of this paperwork. If you have not heard from [place] regarding the results in 2 weeks, please contact this office.      Sore Throat When you have a sore throat, your throat may feel:  Tender.  Burning.  Irritated.  Scratchy.  Painful when you swallow.  Painful when you talk. Many things can cause a sore throat, such as:  An infection.  Allergies.  Dry air.  Smoke or pollution.  Radiation treatment.  Gastroesophageal reflux disease (GERD).  A tumor. A sore throat can be the first sign of another sickness. It can happen with other problems, like:  Coughing.  Sneezing.  Fever.  Swelling in the neck. Most sore throats go away without treatment. Follow these instructions at home:      Take over-the-counter medicines only as told by your doctor. ? If your child has a sore throat, do not give your child aspirin.  Drink enough fluids to keep your pee (urine) pale yellow.  Rest when you feel you need to.  To help with pain: ? Sip warm liquids, such as broth, herbal tea, or warm water. ? Eat or  drink cold or frozen liquids, such as frozen ice pops. ? Gargle with a salt-water mixture 3-4 times a day or as needed. To make a salt-water mixture, add -1 tsp (3-6 g) of salt to 1 cup (237 mL) of warm water. Mix it until you cannot see the salt anymore. ? Suck on hard candy or throat lozenges. ? Put a cool-mist humidifier in your bedroom at night. ? Sit in the bathroom with the door closed for 5-10 minutes while you run hot water in the shower.  Do not use any products that contain nicotine or tobacco, such as cigarettes, e-cigarettes, and chewing tobacco. If you need help quitting, ask your doctor.  Wash your hands well and often with soap and water. If soap and water are not available, use hand sanitizer. Contact a doctor if:  You have a fever for more than 2-3 days.  You keep having symptoms for more than 2-3 days.  Your throat does not get better in 7 days.  You have a fever and your symptoms suddenly get worse.  Your child who is 3 months to 5 years old has a temperature of 102.36F (39C) or higher. Get help right away if:  You have trouble breathing.  You cannot swallow fluids, soft foods, or your saliva.  You have swelling in your throat or neck that gets worse.  You keep feeling sick to your stomach (nauseous).  You keep throwing  up (vomiting). Summary  A sore throat is pain, burning, irritation, or scratchiness in the throat. Many things can cause a sore throat.  Take over-the-counter medicines only as told by your doctor. Do not give your child aspirin.  Drink plenty of fluids, and rest as needed.  Contact a doctor if your symptoms get worse or your sore throat does not get better within 7 days. This information is not intended to replace advice given to you by your health care provider. Make sure you discuss any questions you have with your health care provider. Document Released: 04/08/2008 Document Revised: 11/30/2017 Document Reviewed: 11/30/2017 Elsevier  Interactive Patient Education  2019 Elsevier Inc.  Soft-Food Eating Plan A soft-food eating plan includes foods that are safe and easy to chew and swallow. Your health care provider or dietitian can help you find foods and flavors that fit into this plan. Follow this plan until your health care provider or dietitian says it is safe to start eating other foods and food textures. What are tips for following this plan? General guidelines   Take small bites of food, or cut food into pieces about  inch or smaller. Bite-sized pieces of food are easier to chew and swallow.  Eat moist foods. Avoid overly dry foods.  Avoid foods that: ? Are difficult to swallow, such as dry, chunky, crispy, or sticky foods. ? Are difficult to chew, such as hard, tough, or stringy foods. ? Contain nuts, seeds, or fruits.  Follow instructions from your dietitian about the types of liquids that are safe for you to swallow. You may be allowed to have: ? Thick liquids only. This includes only liquids that are thicker than honey. ? Thin and thick liquids. This includes all beverages and foods that become liquid at room temperature.  To make thick liquids: ? Purchase a commercial liquid thickening powder. These are available at grocery stores and pharmacies. ? Mix the thickener into liquids according to instructions on the label. ? Purchase ready-made thickened liquids. ? Thicken soup by pureeing, straining to remove chunks, and adding flour, potato flakes, or corn starch. ? Add commercial thickener to foods that become liquid at room temperature, such as milk shakes, yogurt, ice cream, gelatin, and sherbet.  Ask your health care provider whether you need to take a fiber supplement. Cooking  Cook meats so they stay tender and moist. Use methods like braising, stewing, or baking in liquid.  Cook vegetables and fruit until they are soft enough to be mashed with a fork.  Peel soft, fresh fruits such as peaches,  nectarines, and melons.  When making soup, make sure chunks of meat and vegetables are smaller than  inch.  Reheat leftover foods slowly so that a tough crust does not form. What foods are allowed? The items listed below may not be a complete list. Talk with your dietitian about what dietary choices are best for you. Grains Breads, muffins, pancakes, or waffles moistened with syrup, jelly, or butter. Dry cereals well-moistened with milk. Moist, cooked cereals. Well-cooked pasta and rice. Vegetables All soft-cooked vegetables. Shredded lettuce. Fruits All canned and cooked fruits. Soft, peeled fresh fruits. Strawberries. Dairy Milk. Cream. Yogurt. Cottage cheese. Soft cheese without the rind. Meats and other protein foods Tender, moist ground meat, poultry, or fish. Meat cooked in gravy or sauces. Eggs. Sweets and desserts Ice cream. Milk shakes. Sherbet. Pudding. Fats and oils Butter. Margarine. Olive, canola, sunflower, and grapeseed oil. Smooth salad dressing. Smooth cream cheese. Mayonnaise. Gravy. What foods  are not allowed? The items listed bemay not be a complete list. Talk with your dietitian about what dietary choices are best for you. Grains Coarse or dry cereals, such as bran, granola, and shredded wheat. Tough or chewy crusty breads, such as [place] bread or baguettes. Breads with nuts, seeds, or fruit. Vegetables All raw vegetables. Cooked corn. Cooked vegetables that are tough or stringy. Tough, crisp, fried potatoes and potato skins. Fruits Fresh fruits with skins or seeds, or both, such as apples, pears, and grapes. Stringy, high-pulp fruits, such as papaya, pineapple, coconut, and mango. Fruit leather and all dried fruit. Dairy Yogurt with nuts or coconut. Meats and other protein foods Hard, dry sausages. Dry meat, poultry, or fish. Meats with gristle. Fish with bones. Fried meat or fish. Lunch meat and hotdogs. Nuts and seeds. Chunky peanut butter or other nut  butters. Sweets and desserts Cakes or cookies that are very dry or chewy. Desserts with dried fruit, nuts, or coconut. Fried pastries. Very rich pastries. Fats and oils Cream cheese with fruit or nuts. Salad dressings with seeds or chunks. Summary  A soft-food eating plan includes foods that are safe and easy to swallow. Generally, the foods should be soft enough to be mashed with a fork.  Avoid foods that are dry, hard to chew, crunchy, sticky, stringy, or crispy.  Ask your health care provider whether you need to thicken your liquids and if you need to take a fiber supplement. This information is not intended to replace advice given to you by your health care provider. Make sure you discuss any questions you have with your health care provider. Document Released: 10/07/2007 Document Revised: 09/02/2016 Document Reviewed: 09/02/2016 Elsevier Interactive Patient Education  2019 Elsevier Inc.  Strep Throat  Strep throat is an infection of the throat. It is caused by germs. Strep throat spreads from person to person because of coughing, sneezing, or close contact. Follow these instructions at home: Medicines  Take over-the-counter and prescription medicines only as told by your doctor.  Take your antibiotic medicine as told by your doctor. Do not stop taking the medicine even if you feel better.  Have family members who also have a sore throat or fever go to a doctor. Eating and drinking  Do not share food, drinking cups, or personal items.  Try eating soft foods until your sore throat feels better.  Drink enough fluid to keep your pee (urine) clear or pale yellow. General instructions  Rinse your mouth (gargle) with a salt-water mixture 3-4 times per day or as needed. To make a salt-water mixture, stir -1 tsp of salt into 1 cup of warm water.  Make sure that all people in your house wash their hands well.  Rest.  Stay home from school or work until you have been taking  antibiotics for 24 hours.  Keep all follow-up visits as told by your doctor. This is important. Contact a doctor if:  Your neck keeps getting bigger.  You get a rash, cough, or earache.  You cough up thick liquid that is green, yellow-brown, or bloody.  You have pain that does not get better with medicine.  Your problems get worse instead of getting better.  You have a fever. Get help right away if:  You throw up (vomit).  You get a very bad headache.  You neck hurts or it feels stiff.  You have chest pain or you are short of breath.  You have drooling, very bad throat pain, or changes  in your voice.  Your neck is swollen or the skin gets red and tender.  Your mouth is dry or you are peeing less than normal.  You keep feeling more tired or it is hard to wake up.  Your joints are red or they hurt. This information is not intended to replace advice given to you by your health care provider. Make sure you discuss any questions you have with your health care provider. Document Released: 12/17/2007 Document Revised: 02/27/2016 Document Reviewed: 10/23/2014 Elsevier Interactive Patient Education  2019 Elsevier Inc.   Amoxicillin; Clavulanic Acid tablets What is this medicine? AMOXICILLIN; CLAVULANIC ACID (a mox i SIL in; KLAV yoo lan ic AS id) is a penicillin antibiotic. It is used to treat certain kinds of bacterial infections. It will not work for colds, flu, or other viral infections. This medicine may be used for other purposes; ask your health care provider or pharmacist if you have questions. COMMON BRAND NAME(S): Augmentin What should I tell my health care provider before I take this medicine? They need to know if you have any of these conditions: -bowel disease, like colitis -kidney disease -liver disease -mononucleosis -an unusual or allergic reaction to amoxicillin, penicillin, cephalosporin, other antibiotics, clavulanic acid, other medicines, foods, dyes, or  preservatives -pregnant or trying to get pregnant -breast-feeding How should I use this medicine? Take this medicine by mouth with a full glass of water. Follow the directions on the prescription label. Take at the start of a meal. Do not crush or chew. If the tablet has a score line, you may cut it in half at the score line for easier swallowing. Take your medicine at regular intervals. Do not take your medicine more often than directed. Take all of your medicine as directed even if you think you are better. Do not skip doses or stop your medicine early. Talk to your pediatrician regarding the use of this medicine in children. Special care may be needed. Overdosage: If you think you have taken too much of this medicine contact a poison control center or emergency room at once. NOTE: This medicine is only for you. Do not share this medicine with others. What if I miss a dose? If you miss a dose, take it as soon as you can. If it is almost time for your next dose, take only that dose. Do not take double or extra doses. What may interact with this medicine? -allopurinol -anticoagulants -birth control pills -methotrexate -probenecid This list may not describe all possible interactions. Give your health care provider a list of all the medicines, herbs, non-prescription drugs, or dietary supplements you use. Also tell them if you smoke, drink alcohol, or use illegal drugs. Some items may interact with your medicine. What should I watch for while using this medicine? Tell your doctor or health care professional if your symptoms do not improve. Do not treat diarrhea with over the counter products. Contact your doctor if you have diarrhea that lasts more than 2 days or if it is severe and watery. If you have diabetes, you may get a false-positive result for sugar in your urine. Check with your doctor or health care professional. Birth control pills may not work properly while you are taking this  medicine. Talk to your doctor about using an extra method of birth control. What side effects may I notice from receiving this medicine? Side effects that you should report to your doctor or health care professional as soon as possible: -allergic reactions like  skin rash, itching or hives, swelling of the face, lips, or tongue -breathing problems -dark urine -fever or chills, sore throat -redness, blistering, peeling or loosening of the skin, including inside the mouth -seizures -trouble passing urine or change in the amount of urine -unusual bleeding, bruising -unusually weak or tired -white patches or sores in the mouth or throat Side effects that usually do not require medical attention (report to your doctor or health care professional if they continue or are bothersome): -diarrhea -dizziness -headache -nausea, vomiting -stomach upset -vaginal or anal irritation This list may not describe all possible side effects. Call your doctor for medical advice about side effects. You may report side effects to FDA at 1-800-FDA-1088. Where should I keep my medicine? Keep out of the reach of children. Store at room temperature below 25 degrees C (77 degrees F). Keep container tightly closed. Throw away any unused medicine after the expiration date. NOTE: This sheet is a summary. It may not cover all possible information. If you have questions about this medicine, talk to your doctor, pharmacist, or health care provider.  2019 Elsevier/Gold Standard (2007-09-23 12:04:30)

## 2019-05-06 ENCOUNTER — Other Ambulatory Visit: Payer: Self-pay | Admitting: Obstetrics & Gynecology

## 2019-05-06 DIAGNOSIS — Z1231 Encounter for screening mammogram for malignant neoplasm of breast: Secondary | ICD-10-CM

## 2019-06-27 ENCOUNTER — Ambulatory Visit
Admission: RE | Admit: 2019-06-27 | Discharge: 2019-06-27 | Disposition: A | Discharge status: Home / Self Care | Payer: 59 | Source: Ambulatory Visit | Attending: Obstetrics & Gynecology | Admitting: Obstetrics & Gynecology

## 2019-06-27 ENCOUNTER — Other Ambulatory Visit: Payer: Self-pay

## 2019-06-27 DIAGNOSIS — Z1231 Encounter for screening mammogram for malignant neoplasm of breast: Secondary | ICD-10-CM

## 2020-06-11 ENCOUNTER — Telehealth: Payer: Self-pay | Admitting: *Deleted

## 2020-06-11 NOTE — Telephone Encounter (Signed)
Left message on VM to call back to schedule for eval with Dr Anne Fu.  If pt calls back today - he would like for her to be seen 11/30 at 8:20 am if possible.  (per Dr Lajoyce Lauber - palps)

## 2020-06-11 NOTE — Telephone Encounter (Signed)
Pt has been scheduled for 11/30

## 2020-06-12 ENCOUNTER — Ambulatory Visit: Payer: 59 | Admitting: Cardiology

## 2020-06-12 ENCOUNTER — Other Ambulatory Visit: Payer: Self-pay

## 2020-06-12 ENCOUNTER — Encounter: Payer: Self-pay | Admitting: *Deleted

## 2020-06-12 ENCOUNTER — Encounter: Payer: Self-pay | Admitting: Cardiology

## 2020-06-12 VITALS — BP 120/84 | HR 68 | Ht 65.0 in | Wt 160.8 lb

## 2020-06-12 DIAGNOSIS — Z0000 Encounter for general adult medical examination without abnormal findings: Secondary | ICD-10-CM

## 2020-06-12 DIAGNOSIS — R002 Palpitations: Secondary | ICD-10-CM

## 2020-06-12 DIAGNOSIS — R0789 Other chest pain: Secondary | ICD-10-CM | POA: Diagnosis not present

## 2020-06-12 DIAGNOSIS — R5383 Other fatigue: Secondary | ICD-10-CM | POA: Diagnosis not present

## 2020-06-12 DIAGNOSIS — R0602 Shortness of breath: Secondary | ICD-10-CM | POA: Diagnosis not present

## 2020-06-12 LAB — COMPREHENSIVE METABOLIC PANEL
ALT: 7 IU/L (ref 0–32)
AST: 11 IU/L (ref 0–40)
Albumin/Globulin Ratio: 1.9 (ref 1.2–2.2)
Albumin: 4.8 g/dL (ref 3.8–4.8)
Alkaline Phosphatase: 66 IU/L (ref 44–121)
BUN/Creatinine Ratio: 14 (ref 9–23)
BUN: 10 mg/dL (ref 6–24)
Bilirubin Total: 0.5 mg/dL (ref 0.0–1.2)
CO2: 25 mmol/L (ref 20–29)
Calcium: 9.5 mg/dL (ref 8.7–10.2)
Chloride: 100 mmol/L (ref 96–106)
Creatinine, Ser: 0.69 mg/dL (ref 0.57–1.00)
GFR calc Af Amer: 121 mL/min/{1.73_m2} (ref >59)
GFR calc non Af Amer: 105 mL/min/{1.73_m2} (ref >59)
Globulin, Total: 2.5 g/dL (ref 1.5–4.5)
Glucose: 91 mg/dL (ref 65–99)
Potassium: 4.3 mmol/L (ref 3.5–5.2)
Sodium: 138 mmol/L (ref 134–144)
Total Protein: 7.3 g/dL (ref 6.0–8.5)

## 2020-06-12 LAB — CBC
Hematocrit: 40.9 % (ref 34.0–46.6)
Hemoglobin: 14.1 g/dL (ref 11.1–15.9)
MCH: 30.3 pg (ref 26.6–33.0)
MCHC: 34.5 g/dL (ref 31.5–35.7)
MCV: 88 fL (ref 79–97)
Platelets: 372 10*3/uL (ref 150–450)
RBC: 4.66 x10E6/uL (ref 3.77–5.28)
RDW: 11.8 % (ref 11.7–15.4)
WBC: 7 10*3/uL (ref 3.4–10.8)

## 2020-06-12 LAB — LIPID PANEL
Chol/HDL Ratio: 2.9 ratio (ref 0.0–4.4)
Cholesterol, Total: 226 mg/dL — ABNORMAL HIGH (ref 100–199)
HDL: 79 mg/dL (ref >39)
LDL Chol Calc (NIH): 130 mg/dL — ABNORMAL HIGH (ref 0–99)
Triglycerides: 97 mg/dL (ref 0–149)
VLDL Cholesterol Cal: 17 mg/dL (ref 5–40)

## 2020-06-12 LAB — TSH: TSH: 1.57 u[IU]/mL (ref 0.450–4.500)

## 2020-06-12 NOTE — Patient Instructions (Addendum)
Medication Instructions:  The current medical regimen is effective;  continue present plan and medications.  *If you need a refill on your cardiac medications before your next appointment, please call your pharmacy*  Lab Work: Please have blood work today (CMP, CBC, Lipid, TSH) If you have labs (blood work) drawn today and your tests are completely normal, you will receive your results only by: Marland Kitchen MyChart Message (if you have MyChart) OR . A paper copy in the mail If you have any lab test that is abnormal or we need to change your treatment, we will call you to review the results.  Testing/Procedures: Your physician has requested that you have an echocardiogram. Echocardiography is a painless test that uses sound waves to create images of your heart. It provides your doctor with information about the size and shape of your heart and how well your heart's chambers and valves are working. This procedure takes approximately one hour. There are no restrictions for this procedure.  Your physician has requested that you have an exercise tolerance test. For further information please visit [email or web address] Please also follow instruction sheet, as given. You will need Covid screening prior to this testing.  Testing is completed at [address] [place], [place], [place] [address].  This is a drive-thru testing site.  Please follow the signs for screening and remain in your vehicle.  Someone will come to you to complete the test.  Once it has been completed you will need to self quarantine at home until the date of your treadmill test.  ZIO XT- Long Term Monitor Instructions   Your physician has requested you wear your ZIO patch monitor 14 days.   This is a single patch monitor.  [employer] supplies one patch monitor per enrollment.  Additional stickers are not available.   Please do not apply patch if you will be having a Nuclear Stress Test, Echocardiogram, Cardiac CT, MRI, or Chest Xray during the time  frame you would be wearing the monitor. The patch cannot be worn during these tests.  You cannot remove and re-apply the ZIO XT patch monitor.   Your ZIO patch monitor will be sent USPS Priority mail from [facility] directly to your home address. The monitor may also be mailed to a PO BOX if home delivery is not available.   It may take 3-5 days to receive your monitor after you have been enrolled.   Once you have received you monitor, please review enclosed instructions.  Your monitor has already been registered assigning a specific monitor serial # to you.   Applying the monitor   Shave hair from upper left chest.   Hold abrader disc by orange tab.  Rub abrader in 40 strokes over left upper chest as indicated in your monitor instructions.   Clean area with 4 enclosed alcohol pads .  Use all pads to assure are is cleaned thoroughly.  Let dry.   Apply patch as indicated in monitor instructions.  Patch will be place under collarbone on left side of chest with arrow pointing upward.   Rub patch adhesive wings for 2 minutes.Remove white label marked "1".  Remove white label marked "2".  Rub patch adhesive wings for 2 additional minutes.   While looking in a mirror, press and release button in center of patch.  A small green light will flash 3-4 times .  This will be your only indicator the monitor has been turned on.     Do not shower for the first  24 hours.  You may shower after the first 24 hours.   Press button if you feel a symptom. You will hear a small click.  Record Date, Time and Symptom in the Patient Log Book.   When you are ready to remove patch, follow instructions on last 2 pages of Patient Log Book.  Stick patch monitor onto last page of Patient Log Book.   Place Patient Log Book in [place] box.  Use locking tab on box and tape box closed securely.  The [employer] and [employer] has [employer] on it.  Please place in mailbox as soon as possible.  Your physician should have  your test results approximately 7 days after the monitor has been mailed back to [facility].   Call [facility] Customer Care at [phone] if you have questions regarding your ZIO XT patch monitor.  Call them immediately if you see an orange light blinking on your monitor.   If your monitor falls off in less than 4 days contact our Monitor department at [phone].  If your monitor becomes loose or falls off after 4 days call [employer] at [phone] for suggestions on securing your monitor.   Follow-Up: At [facility], you and your health needs are our priority.  As part of our continuing mission to provide you with exceptional heart care, we have created designated Provider Care Teams.  These Care Teams include your primary Cardiologist (physician) and Advanced Practice Providers (APPs -  Physician Assistants and Nurse Practitioners) who all work together to provide you with the care you need, when you need it.  We recommend signing up for the patient portal called "MyChart".  Sign up information is provided on this After Visit Summary.  MyChart is used to connect with patients for Virtual Visits (Telemedicine).  Patients are able to view lab/test results, encounter notes, upcoming appointments, etc.  Non-urgent messages can be sent to your provider as well.   To learn more about what you can do with MyChart, go to [email or web address].    Your next appointment:   Further follow up will be based on the results of the above testing.  Thank you for choosing [facility]!!

## 2020-06-12 NOTE — Progress Notes (Signed)
Patient ID: Kristi Haynes, female   DOB: Apr 08, 1976, 44 y.o.   MRN: [id number] Patient enrolled for [employer] to ship a 14 day ZIO XT long term holter monitor to her home.

## 2020-06-12 NOTE — Progress Notes (Signed)
Cardiology Office Note:    Date:  06/12/2020   ID:  Kristi Haynes, DOB 1976/01/21, MRN [id number]  PCP:  Patient, No Pcp Per  [facility] Cardiologist:  No primary care provider on file.  [facility] Electrophysiologist:  None   Referring MD: No ref. provider found     History of Present Illness:    Kristi Haynes is a 44 y.o. female here for the evaluation of arrhythmia and shortness of breath.  She has had this off and on for many years but it has become fairly prominent over the past week or so.  She ended up going to urgent care over the weekend and they recommended seeing cardiology for further evaluation.  In review of the urgent care note from 06/09/2020 she was having palpitation shortness of breath headache and fatigue for 3 days and endorsed concern for possible Covid and had nasal swab testing.  Since Wed palps have been constant. 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 When lay on left they speed up, more of them. Had some mild CP as well. Skips worse with activity. Winded. Like a cough. Some SOB when laying flat.   Many years ago, 2010 it appears that she was seen by Dr. Olga Millers and had echocardiogram performed.  An office note from 2016 from urgent care was reviewed as well which shows left-sided thoracic pain wrapping around her rib axilla down and up the elbow.  Has family history of MI, father had 2 heart attacks in his 51s.  No tobacco. Rare in the past.  No DM. No syncope. But mild dizzy. HA noted past.   Brother in line for heart transplant.   During interview, at the end was teary-eyed.  Frustrated by the way she feels, fatigue etc.   Past Medical History:  Diagnosis Date  . Heart palpitations   . Urinary tract infection     Past Surgical History:  Procedure Laterality Date  . TONSILLECTOMY     "3rd tonsil" removed    Current Medications: Current Meds  Medication Sig  . Multiple Vitamins-Minerals (MULTIVITAMIN ADULT PO)  multivitamin     Allergies:   Patient has no known allergies.   Social History   Socioeconomic History  . Marital status: Married    Spouse name: Not on file  . Number of children: Not on file  . Years of education: Not on file  . Highest education level: Not on file  Occupational History  . Not on file  Tobacco Use  . Smoking status: Former Smoker    Quit date: 10/05/2009    Years since quitting: 10.6  . Smokeless tobacco: Never Used  Vaping Use  . Vaping Use: Never used  Substance and Sexual Activity  . Alcohol use: Yes    Comment: Occasional  . Drug use: No  . Sexual activity: Yes    Birth control/protection: Surgical    Comment: vasectomy  Other Topics Concern  . Not on file  Social History Narrative  . Not on file   Social Determinants of Health   Financial Resource Strain:   . Difficulty of Paying Living Expenses: Not on file  Food Insecurity:   . Worried About Programme researcher, broadcasting/film/video in the Last Year: Not on file  . Ran Out of Food in the Last Year: Not on file  Transportation Needs:   . Lack of Transportation (Medical): Not on file  . Lack of Transportation (Non-Medical): Not on file  Physical Activity:   . Days  of Exercise per Week: Not on file  . Minutes of Exercise per Session: Not on file  Stress:   . Feeling of Stress : Not on file  Social Connections:   . Frequency of Communication with Friends and Family: Not on file  . Frequency of Social Gatherings with Friends and Family: Not on file  . Attends Religious Services: Not on file  . Active Member of Clubs or Organizations: Not on file  . Attends BankerClub or Organization Meetings: Not on file  . Marital Status: Not on file     Family History: The patient's family history includes Breast cancer (age of onset: 7360) in her mother; Cancer in her maternal grandmother and mother; Diabetes in her maternal grandfather and maternal grandmother; High Cholesterol in her brother, maternal grandfather, and maternal  grandmother; Hypertension in her brother; Stroke in her maternal grandfather. There is no history of Anesthesia problems.  Father had MIs in his 2740  ROS:   Please see the history of present illness.    Denies any fevers chills nausea vomiting syncope bleeding all other systems reviewed and are negative.  EKGs/Labs/Other Studies Reviewed:    The following studies were reviewed today:  ECHO 2010: Left ventricle: The cavity size was normal. Systolic function was  normal. The estimated ejection fraction was in the range of 60% to  65%.  Aortic valve: Trileaflet.  Aorta: The aorta was normal, not dilated, and non-diseased.  Mitral valve: Mildly thickened leaflets . Doppler: Trivial  regurgitation. Peak gradient: 4mm Hg (D).  Left atrium: The atrium was normal in size.  Right ventricle: The cavity size was normal.  Pulmonic valve: Structurally normal valve. Cusp separation was  normal. Doppler: Transvalvular velocity was within the normal range.  No regurgitation.  Tricuspid valve: Doppler: Trivial regurgitation.  Pulmonary artery: The main pulmonary artery was normal-sized.  Right atrium: The atrium was normal in size.  Pericardium: There was no pericardial effusion.  Post procedure conclusions  Ascending Aorta: - The aorta was normal, not dilated, and non-diseased.    EKG:  EKG is  ordered today.  The ekg ordered today demonstrates sinus rhythm 68 with no other abnormalities . prior EKG 04/11/2015 demonstrates sinus bradycardia 58 with no other abnormalities.  Recent Labs: No results found for requested labs within last 8760 hours.  Recent Lipid Panel    Component Value Date/Time   CHOL 189 04/11/2015 1400   TRIG 61 04/11/2015 1400   HDL 70 04/11/2015 1400   CHOLHDL 2.7 04/11/2015 1400   VLDL 12 04/11/2015 1400   LDLCALC 107 04/11/2015 1400     Risk Assessment/Calculations:       Physical Exam:    VS:  BP 120/84   Pulse 68   Ht 5\' 5"  (1.651 m)    Wt 160 lb 12.8 oz (72.9 kg)   SpO2 97%   BMI 26.76 kg/m     Wt Readings from Last 3 Encounters:  06/12/20 160 lb 12.8 oz (72.9 kg)  08/21/18 154 lb (69.9 kg)  01/23/17 151 lb (68.5 kg)     GEN:  Well nourished, well developed in no acute distress HEENT: Normal NECK: No JVD; No carotid bruits LYMPHATICS: No lymphadenopathy CARDIAC: RRR, no murmurs, rubs, gallops, rare ectopy noted RESPIRATORY:  Clear to auscultation without rales, wheezing or rhonchi  ABDOMEN: Soft, non-tender, non-distended MUSCULOSKELETAL:  No edema; No deformity  SKIN: Warm and dry NEUROLOGIC:  Alert and oriented x 3 PSYCHIATRIC:  Normal affect   ASSESSMENT:  1. Other fatigue   2. Atypical chest pain   3. Shortness of breath   4. Wellness examination   5. Palpitations    PLAN:    In order of problems listed above:  Palpitations -We will go ahead and check a Zio patch monitor for 2 weeks to make sure she does not have any adverse arrhythmias. -During auscultation I did hear a few ectopic beats.  Sound like PVCs or PACs.  Certainly these can be exacerbated by positional changes, such as laying on her left side like she describes.  Usually they extinguish during activity however she is still feeling symptoms during walking etc.  Shortness of breath with activity -We will check an echocardiogram to ensure proper structure and function of her heart.  Her brother was told he had a congenital heart problem that he is now on the heart transplant list for.  Atypical chest pain -Occasional left sided chest discomfort noted.  We will check an exercise treadmill test since she is having some symptoms with activity.  Her husband was present for discussion.  I will also check some lab work, complete metabolic profile CBC lipid panel and TSH.  Promote daily exercise.   Shared Decision Making/Informed Consent    The risks [chest pain, shortness of breath, cardiac arrhythmias, dizziness, blood pressure  fluctuations, myocardial infarction, stroke/transient ischemic attack, and life-threatening complications (estimated to be 1 in 10,000)], benefits (risk stratification, diagnosing coronary artery disease, treatment guidance) and alternatives of an exercise tolerance test were discussed in detail with Kristi Haynes and she agrees to proceed.      Medication Adjustments/Labs and Tests Ordered: Current medicines are reviewed at length with the patient today.  Concerns regarding medicines are outlined above.  Orders Placed This Encounter  Procedures  . Lipid panel  . CBC  . Comprehensive metabolic panel  . TSH  . EXERCISE TOLERANCE TEST (ETT)  . LONG TERM MONITOR (3-14 DAYS)  . EKG 12-Lead  . ECHOCARDIOGRAM COMPLETE   No orders of the defined types were placed in this encounter.   Patient Instructions  Medication Instructions:  The current medical regimen is effective;  continue present plan and medications.  *If you need a refill on your cardiac medications before your next appointment, please call your pharmacy*  Lab Work: Please have blood work today (CMP, CBC, Lipid, TSH) If you have labs (blood work) drawn today and your tests are completely normal, you will receive your results only by: Marland Kitchen MyChart Message (if you have MyChart) OR . A paper copy in the mail If you have any lab test that is abnormal or we need to change your treatment, we will call you to review the results.  Testing/Procedures: Your physician has requested that you have an echocardiogram. Echocardiography is a painless test that uses sound waves to create images of your heart. It provides your doctor with information about the size and shape of your heart and how well your heart's chambers and valves are working. This procedure takes approximately one hour. There are no restrictions for this procedure.  Your physician has requested that you have an exercise tolerance test. For further information please visit  [email or web address] Please also follow instruction sheet, as given. You will need Covid screening prior to this testing.  Testing is completed at [address] [place], [place], [place] [address].  This is a drive-thru testing site.  Please follow the signs for screening and remain in your vehicle.  Someone will come to you to complete  the test.  Once it has been completed you will need to self quarantine at home until the date of your treadmill test.  ZIO XT- Long Term Monitor Instructions   Your physician has requested you wear your ZIO patch monitor 14 days.   This is a single patch monitor.  [employer] supplies one patch monitor per enrollment.  Additional stickers are not available.   Please do not apply patch if you will be having a Nuclear Stress Test, Echocardiogram, Cardiac CT, MRI, or Chest Xray during the time frame you would be wearing the monitor. The patch cannot be worn during these tests.  You cannot remove and re-apply the ZIO XT patch monitor.   Your ZIO patch monitor will be sent USPS Priority mail from [facility] directly to your home address. The monitor may also be mailed to a PO BOX if home delivery is not available.   It may take 3-5 days to receive your monitor after you have been enrolled.   Once you have received you monitor, please review enclosed instructions.  Your monitor has already been registered assigning a specific monitor serial # to you.   Applying the monitor   Shave hair from upper left chest.   Hold abrader disc by orange tab.  Rub abrader in 40 strokes over left upper chest as indicated in your monitor instructions.   Clean area with 4 enclosed alcohol pads .  Use all pads to assure are is cleaned thoroughly.  Let dry.   Apply patch as indicated in monitor instructions.  Patch will be place under collarbone on left side of chest with arrow pointing upward.   Rub patch adhesive wings for 2 minutes.Remove white label marked "1".  Remove white label  marked "2".  Rub patch adhesive wings for 2 additional minutes.   While looking in a mirror, press and release button in center of patch.  A small green light will flash 3-4 times .  This will be your only indicator the monitor has been turned on.     Do not shower for the first 24 hours.  You may shower after the first 24 hours.   Press button if you feel a symptom. You will hear a small click.  Record Date, Time and Symptom in the Patient Log Book.   When you are ready to remove patch, follow instructions on last 2 pages of Patient Log Book.  Stick patch monitor onto last page of Patient Log Book.   Place Patient Log Book in [place] box.  Use locking tab on box and tape box closed securely.  The [employer] and [employer] has [employer] on it.  Please place in mailbox as soon as possible.  Your physician should have your test results approximately 7 days after the monitor has been mailed back to [facility]   Call [facility] Customer Care at [phone] if you have questions regarding your ZIO XT patch monitor.  Call them immediately if you see an orange light blinking on your monitor.   If your monitor falls off in less than 4 days contact our Monitor department at [phone].  If your monitor becomes loose or falls off after 4 days call [employer] at [phone] for suggestions on securing your monitor.   Follow-Up: At [facility], you and your health needs are our priority.  As part of our continuing mission to provide you with exceptional heart care, we have created designated Provider Care Teams.  These Care Teams include your  primary Cardiologist (physician) and Advanced Practice Providers (APPs -  Physician Assistants and Nurse Practitioners) who all work together to provide you with the care you need, when you need it.  We recommend signing up for the patient portal called "MyChart".  Sign up information is provided on this After Visit Summary.  MyChart is used to connect with  patients for Virtual Visits (Telemedicine).  Patients are able to view lab/test results, encounter notes, upcoming appointments, etc.  Non-urgent messages can be sent to your provider as well.   To learn more about what you can do with MyChart, go to [email or web address].    Your next appointment:   Further follow up will be based on the results of the above testing.  Thank you for choosing [facility]!!         Signed, Donato Schultz, MD  06/12/2020 9:25 AM     [facility]

## 2020-06-15 ENCOUNTER — Ambulatory Visit (INDEPENDENT_AMBULATORY_CARE_PROVIDER_SITE_OTHER): Payer: 59

## 2020-06-15 DIAGNOSIS — R0602 Shortness of breath: Secondary | ICD-10-CM

## 2020-06-15 DIAGNOSIS — R002 Palpitations: Secondary | ICD-10-CM

## 2020-07-09 ENCOUNTER — Ambulatory Visit (HOSPITAL_COMMUNITY): Payer: 59 | Attending: Cardiology

## 2020-07-09 ENCOUNTER — Other Ambulatory Visit: Payer: Self-pay

## 2020-07-09 ENCOUNTER — Telehealth: Payer: Self-pay

## 2020-07-09 DIAGNOSIS — R0602 Shortness of breath: Secondary | ICD-10-CM | POA: Insufficient documentation

## 2020-07-09 LAB — ECHOCARDIOGRAM COMPLETE
Area-P 1/2: 3.86 cm2
S' Lateral: 3.2 cm

## 2020-07-09 NOTE — Telephone Encounter (Signed)
-----   Message from Jake Bathe, MD sent at 07/09/2020  4:10 PM EST ----- Normal pump function, normal valvular function.  Reassuring echocardiogram. Donato Schultz, MD

## 2020-07-09 NOTE — Telephone Encounter (Signed)
The patient has been notified of the result and verbalized understanding.  All questions (if any) were answered. Leanord Hawking, RN 07/09/2020 4:13 PM

## 2020-07-27 ENCOUNTER — Other Ambulatory Visit (HOSPITAL_COMMUNITY)
Admission: RE | Admit: 2020-07-27 | Discharge: 2020-07-27 | Disposition: A | Discharge status: Home / Self Care | Payer: 59 | Source: Ambulatory Visit | Attending: Cardiology | Admitting: Cardiology

## 2020-07-27 DIAGNOSIS — Z20822 Contact with and (suspected) exposure to covid-19: Secondary | ICD-10-CM | POA: Insufficient documentation

## 2020-07-27 DIAGNOSIS — Z01812 Encounter for preprocedural laboratory examination: Secondary | ICD-10-CM | POA: Insufficient documentation

## 2020-07-27 LAB — SARS CORONAVIRUS 2 (TAT 6-24 HRS): SARS Coronavirus 2: NEGATIVE

## 2020-07-30 ENCOUNTER — Telehealth: Payer: Self-pay | Admitting: *Deleted

## 2020-07-30 NOTE — Telephone Encounter (Signed)
[facility] will be virtual visits only on Tuesday 07/31/2020 due to weather/ road conditions.  We are calling to reschedule your GXT.  We have openings Wednesday 08/01/2020 at 12:30 or 1:30.  We also have opening on Thursday  11:30.  Please call [phone] so we can reschedule your appointment .

## 2020-08-02 ENCOUNTER — Telehealth: Payer: Self-pay | Admitting: Cardiology

## 2020-08-02 ENCOUNTER — Other Ambulatory Visit: Payer: Self-pay

## 2020-08-02 ENCOUNTER — Ambulatory Visit (INDEPENDENT_AMBULATORY_CARE_PROVIDER_SITE_OTHER): Payer: 59

## 2020-08-02 DIAGNOSIS — R0602 Shortness of breath: Secondary | ICD-10-CM

## 2020-08-02 LAB — EXERCISE TOLERANCE TEST
Estimated workload: 13.3 METS
Exercise duration (min): 10 min
Exercise duration (sec): 0 s
MPHR: 174 {beats}/min
Peak HR: 164 {beats}/min
Percent HR: 94 %
RPE: 18
Rest HR: 65 {beats}/min

## 2020-08-02 NOTE — Telephone Encounter (Signed)
See stress test results for notes.

## 2020-08-02 NOTE — Telephone Encounter (Signed)
Patient returning call for lab results. 

## 2020-08-29 ENCOUNTER — Encounter: Payer: Self-pay | Admitting: Internal Medicine

## 2020-11-06 ENCOUNTER — Ambulatory Visit: Payer: 59 | Admitting: Internal Medicine

## 2021-07-09 ENCOUNTER — Other Ambulatory Visit: Payer: Self-pay | Admitting: Obstetrics and Gynecology

## 2021-07-09 DIAGNOSIS — Z1231 Encounter for screening mammogram for malignant neoplasm of breast: Secondary | ICD-10-CM

## 2021-07-31 ENCOUNTER — Ambulatory Visit
Admission: RE | Admit: 2021-07-31 | Discharge: 2021-07-31 | Disposition: A | Discharge status: Home / Self Care | Payer: 59 | Source: Ambulatory Visit | Attending: Obstetrics and Gynecology | Admitting: Obstetrics and Gynecology

## 2021-07-31 ENCOUNTER — Other Ambulatory Visit: Payer: Self-pay

## 2021-07-31 DIAGNOSIS — Z1231 Encounter for screening mammogram for malignant neoplasm of breast: Secondary | ICD-10-CM

## 2021-10-08 IMAGING — MG DIGITAL SCREENING BILAT W/ TOMO W/ CAD
8 series · 8 of 24 positions shown · non-contrast
Comparison: Previous exam(s).

CLINICAL DATA: Screening.

EXAM:
DIGITAL SCREENING BILATERAL MAMMOGRAM WITH TOMO AND CAD

[L CC synth-2D]
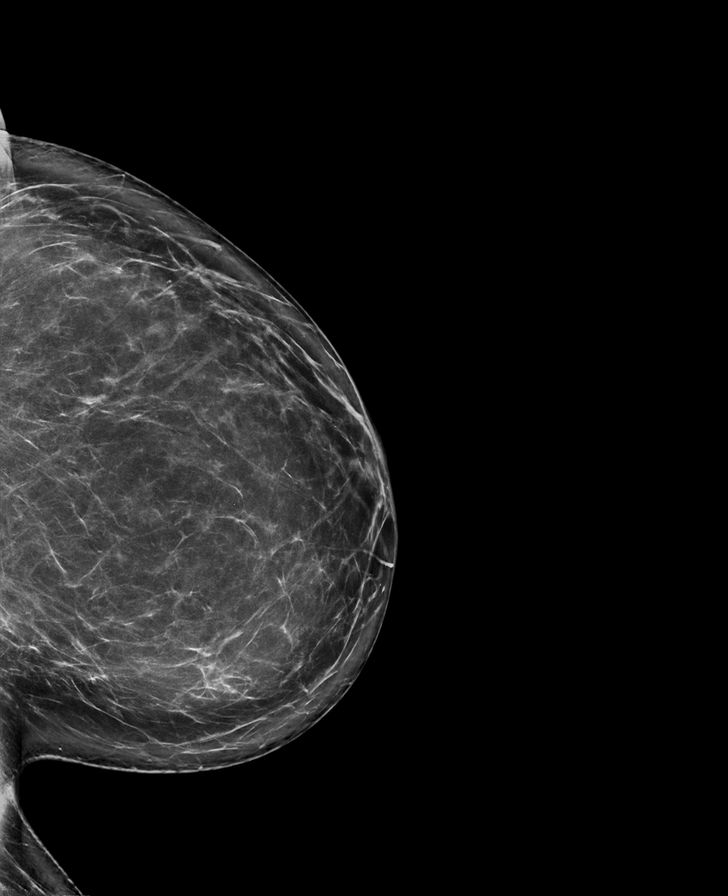

[L MLO synth-2D]
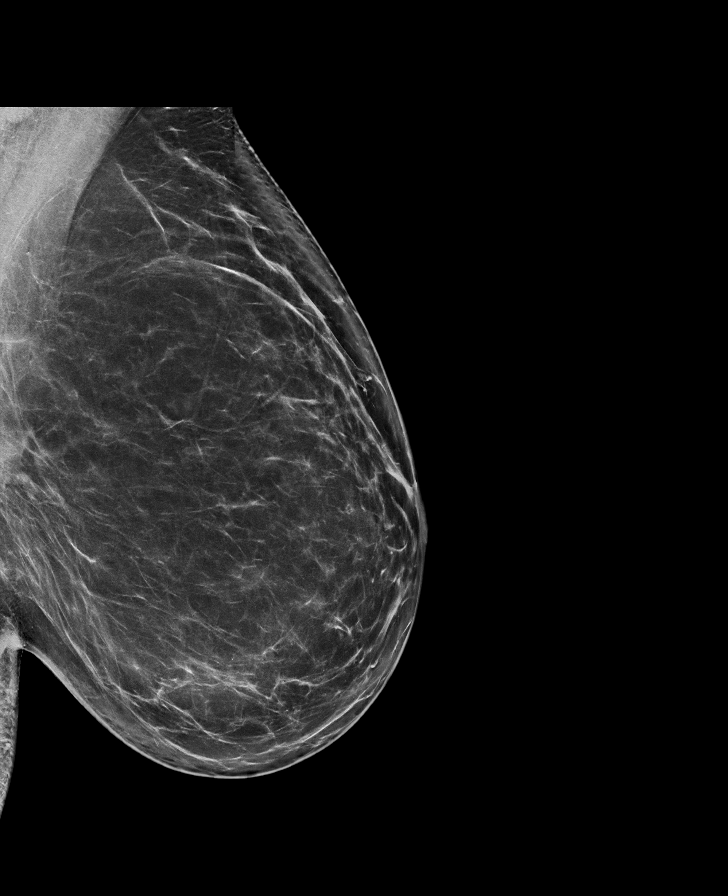

[R MLO synth-2D]
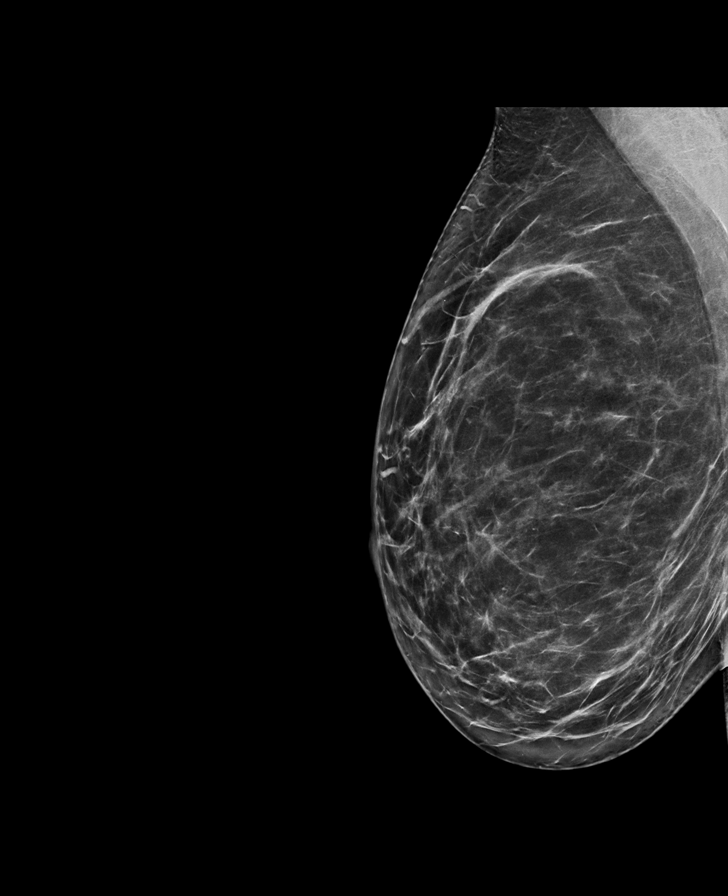

[R CC synth-2D]
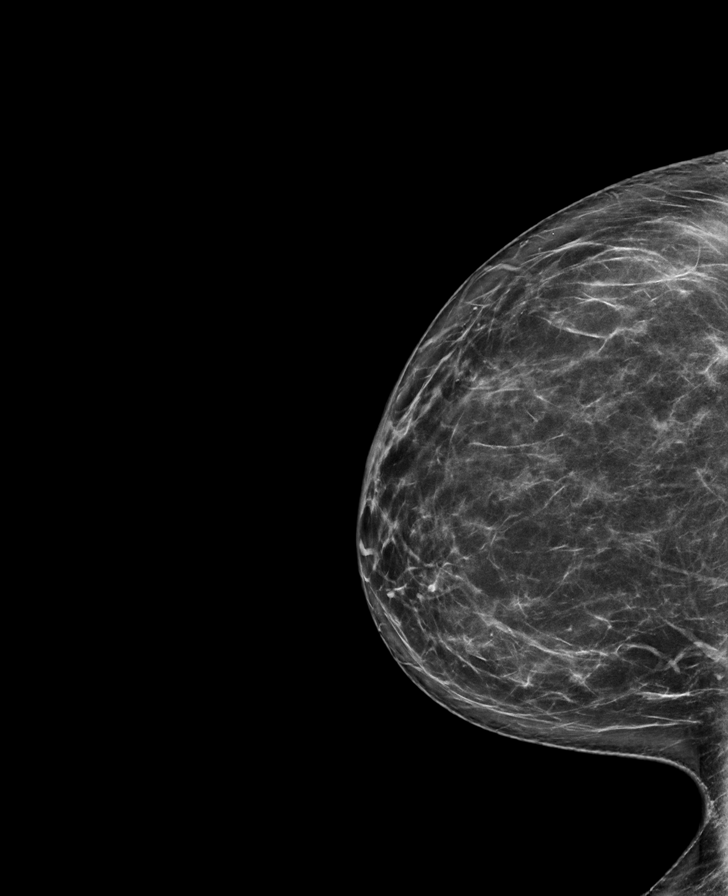

[L MLO tomo · tomo slice 45/89.0]
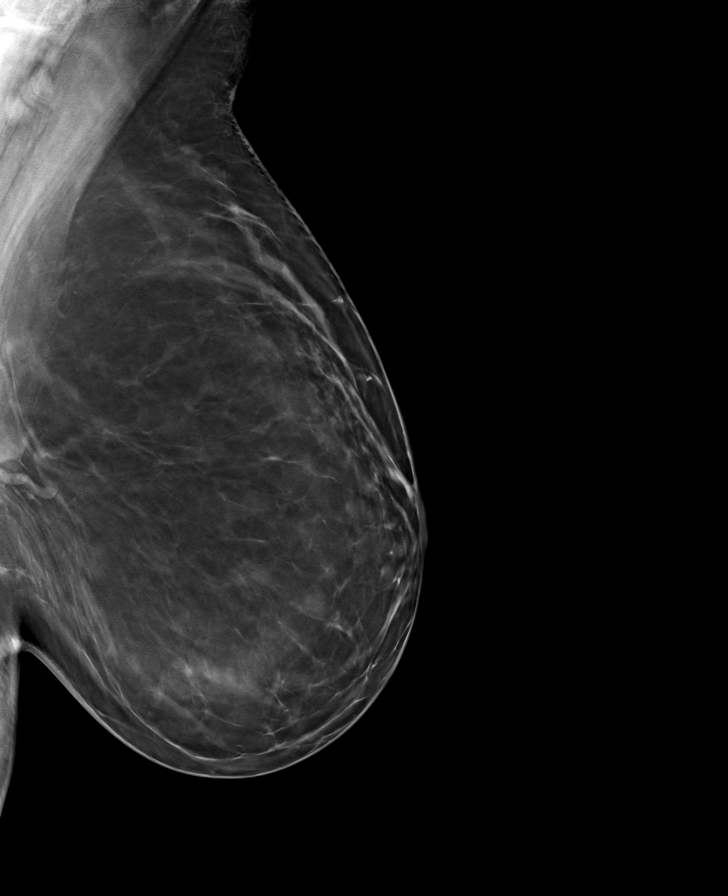

[L CC tomo · tomo slice 43/86.0]
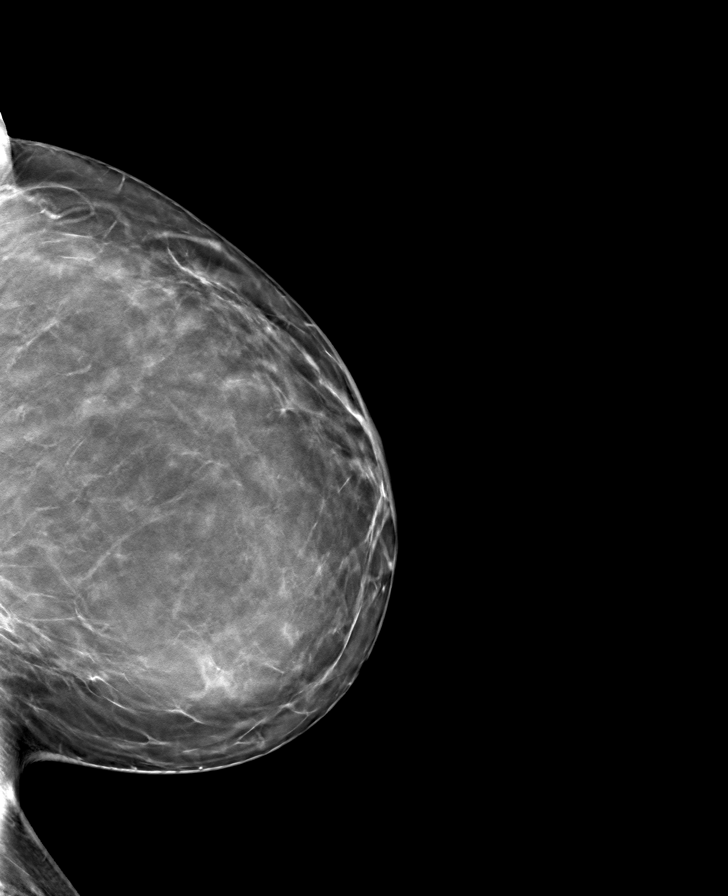

[R MLO tomo · tomo slice 41/82.0]
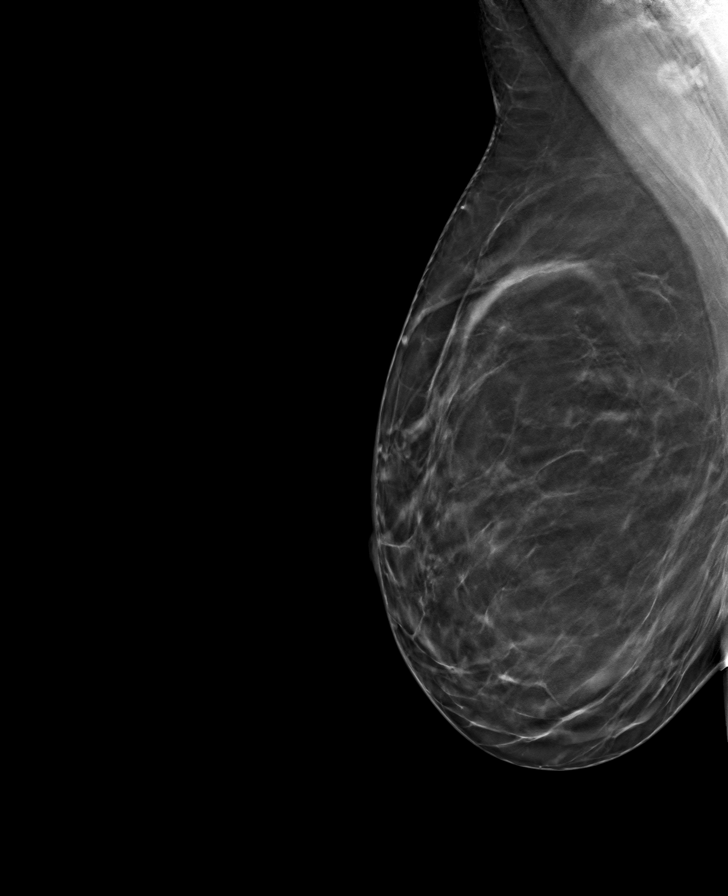

[R CC tomo · tomo slice 40/79.0]
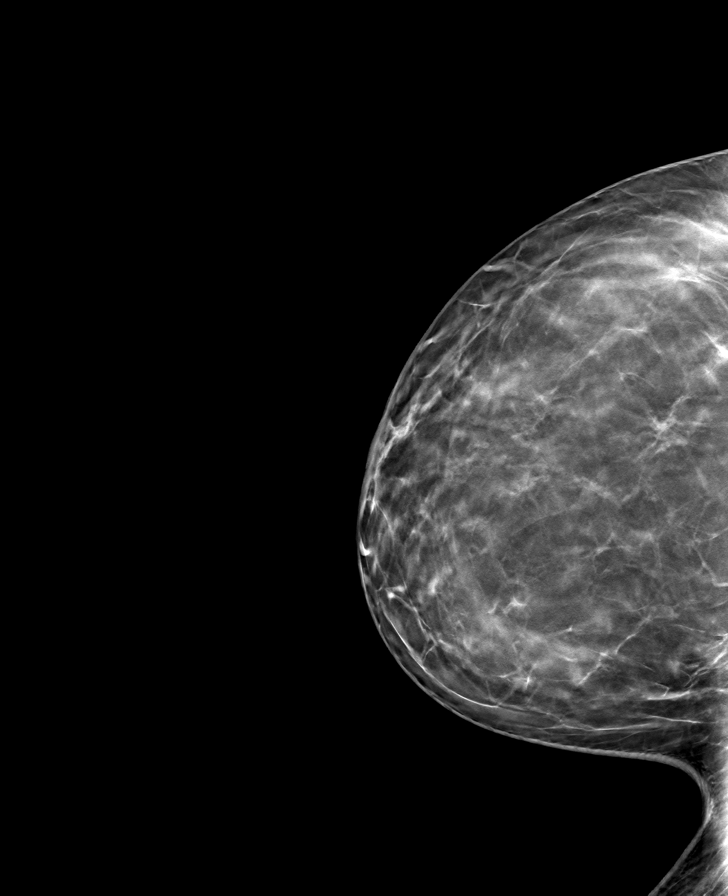

[8 of 24 positions shown; findings below may reference images not displayed]

ACR Breast Density Category b: There are scattered areas of
fibroglandular density.
FINDINGS: There are no findings suspicious for malignancy. Images were
processed with CAD.
IMPRESSION: No mammographic evidence of malignancy. A result letter of this
screening mammogram will be mailed directly to the patient.

RECOMMENDATION:
Screening mammogram in one year. (Code:[id number])

BI-RADS CATEGORY  1: Negative.

## 2022-08-30 ENCOUNTER — Emergency Department (HOSPITAL_COMMUNITY)
Admission: EM | Admit: 2022-08-30 | Discharge: 2022-08-30 | Discharge status: Against Medical Advice | Payer: 59 | Attending: Emergency Medicine | Admitting: Emergency Medicine

## 2022-08-30 ENCOUNTER — Encounter (HOSPITAL_COMMUNITY): Payer: Self-pay | Admitting: *Deleted

## 2022-08-30 ENCOUNTER — Other Ambulatory Visit: Payer: Self-pay

## 2022-08-30 DIAGNOSIS — Z5321 Procedure and treatment not carried out due to patient leaving prior to being seen by health care provider: Secondary | ICD-10-CM | POA: Insufficient documentation

## 2022-08-30 DIAGNOSIS — H5789 Other specified disorders of eye and adnexa: Secondary | ICD-10-CM | POA: Insufficient documentation

## 2022-08-30 NOTE — ED Notes (Signed)
Patient states she talked to her retina specialist on the phone and does not want to be seen anymore.

## 2022-08-30 NOTE — ED Triage Notes (Signed)
The pt had retinal holes    since 1-2 hours ago  she started having itching to her lt eye and fluid building up in it  she just wants it hcecked she called her eye doctor but was not called back  her bp is also elevated

## 2022-09-05 LAB — HM MAMMOGRAPHY

## 2022-09-05 LAB — HM PAP SMEAR: HM Pap smear: NEGATIVE

## 2022-10-14 ENCOUNTER — Emergency Department (HOSPITAL_BASED_OUTPATIENT_CLINIC_OR_DEPARTMENT_OTHER)
Admission: EM | Admit: 2022-10-14 | Discharge: 2022-10-14 | Disposition: A | Discharge status: Home / Self Care | Payer: 59 | Attending: Emergency Medicine | Admitting: Emergency Medicine

## 2022-10-14 ENCOUNTER — Ambulatory Visit (HOSPITAL_COMMUNITY)
Admission: EM | Admit: 2022-10-14 | Discharge: 2022-10-14 | Disposition: A | Discharge status: Home / Self Care | Payer: 59

## 2022-10-14 ENCOUNTER — Other Ambulatory Visit: Payer: Self-pay

## 2022-10-14 ENCOUNTER — Emergency Department (HOSPITAL_BASED_OUTPATIENT_CLINIC_OR_DEPARTMENT_OTHER): Payer: 59 | Admitting: Radiology

## 2022-10-14 ENCOUNTER — Encounter (HOSPITAL_BASED_OUTPATIENT_CLINIC_OR_DEPARTMENT_OTHER): Payer: Self-pay | Admitting: Emergency Medicine

## 2022-10-14 ENCOUNTER — Encounter (HOSPITAL_COMMUNITY): Payer: Self-pay | Admitting: *Deleted

## 2022-10-14 DIAGNOSIS — R079 Chest pain, unspecified: Secondary | ICD-10-CM | POA: Diagnosis not present

## 2022-10-14 DIAGNOSIS — R002 Palpitations: Secondary | ICD-10-CM | POA: Diagnosis not present

## 2022-10-14 DIAGNOSIS — R0602 Shortness of breath: Secondary | ICD-10-CM | POA: Insufficient documentation

## 2022-10-14 DIAGNOSIS — R42 Dizziness and giddiness: Secondary | ICD-10-CM | POA: Insufficient documentation

## 2022-10-14 HISTORY — DX: Essential (primary) hypertension: I10

## 2022-10-14 LAB — CBC
HCT: 37.5 % (ref 36.0–46.0)
Hemoglobin: 12.9 g/dL (ref 12.0–15.0)
MCH: 30.1 pg (ref 26.0–34.0)
MCHC: 34.4 g/dL (ref 30.0–36.0)
MCV: 87.6 fL (ref 80.0–100.0)
Platelets: 369 10*3/uL (ref 150–400)
RBC: 4.28 MIL/uL (ref 3.87–5.11)
RDW: 12.5 % (ref 11.5–15.5)
WBC: 9.8 10*3/uL (ref 4.0–10.5)
nRBC: 0 % (ref 0.0–0.2)

## 2022-10-14 LAB — MAGNESIUM: Magnesium: 2 mg/dL (ref 1.7–2.4)

## 2022-10-14 LAB — SEDIMENTATION RATE: Sed Rate: 12 mm/hr (ref 0–22)

## 2022-10-14 LAB — PREGNANCY, URINE: Preg Test, Ur: NEGATIVE

## 2022-10-14 LAB — BASIC METABOLIC PANEL
Anion gap: 9 (ref 5–15)
BUN: 10 mg/dL (ref 6–20)
CO2: 28 mmol/L (ref 22–32)
Calcium: 9.5 mg/dL (ref 8.9–10.3)
Chloride: 101 mmol/L (ref 98–111)
Creatinine, Ser: 0.6 mg/dL (ref 0.44–1.00)
GFR, Estimated: >60 mL/min (ref >60)
Glucose, Bld: 87 mg/dL (ref 70–99)
Potassium: 3.6 mmol/L (ref 3.5–5.1)
Sodium: 138 mmol/L (ref 135–145)

## 2022-10-14 LAB — TSH: TSH: 2.506 u[IU]/mL (ref 0.350–4.500)

## 2022-10-14 LAB — TROPONIN I (HIGH SENSITIVITY)
Troponin I (High Sensitivity): <2 ng/L (ref <18)
Troponin I (High Sensitivity): <2 ng/L (ref <18)

## 2022-10-14 LAB — D-DIMER, QUANTITATIVE: D-Dimer, Quant: <0.27 ug/mL-FEU (ref 0.00–0.50)

## 2022-10-14 NOTE — ED Provider Notes (Signed)
[facility] Provider Note   CSN: [id number] Arrival date & time: 10/14/22  1809     History  Chief Complaint  Patient presents with   Palpitations    Kristi Haynes is a 47 y.o. female.  HPI   47 year old female presents emergency department with palpitations.  Patient has had palpitations chronically however they have been worsening over the past month.  She presents today because she had an episode of palpitations associated with chest pain, arm pain, shortness of breath and dizziness.  Patient has been seen by cardiology a couple years ago.  Currently she feels baseline.  Was recently diagnosed with high blood pressure placed on hydrochlorothiazide but otherwise no other acute changes in her health.  She has had some mild swelling of her bilateral ankle/feet but denies any other symptoms of DVT/PE.  No active chest pain, no cough or acute illness.  Home Medications Prior to Admission medications   Medication Sig Start Date End Date Taking? Authorizing Provider  hydrochlorothiazide (HYDRODIURIL) 12.5 MG tablet Take 12.5 mg by mouth daily. Take half a tab 09/23/22   [provider]  Multiple Vitamins-Minerals (MULTIVITAMIN ADULT PO) multivitamin    [provider]      Allergies    Patient has no known allergies.    Review of Systems   Review of Systems  Constitutional:  Positive for fatigue. Negative for fever.  Respiratory:  Positive for chest tightness and shortness of breath.   Cardiovascular:  Positive for palpitations. Negative for chest pain and leg swelling.  Gastrointestinal:  Negative for abdominal pain, diarrhea and vomiting.  Musculoskeletal:  Negative for back pain.  Skin:  Negative for rash.  Neurological:  Negative for headaches.    Physical Exam Updated Vital Signs BP 128/84   Pulse 70   Temp 98.4 F (36.9 C)   Resp 20   Ht 5\' 5"  (1.651 m)   Wt 78.9 kg   LMP 10/07/2022 (Approximate)    SpO2 99%   BMI 28.96 kg/m  Physical Exam Vitals and nursing note reviewed.  Constitutional:      General: She is not in acute distress.    Appearance: Normal appearance. She is not ill-appearing.  HENT:     Head: Normocephalic.     Mouth/Throat:     Mouth: Mucous membranes are moist.  Cardiovascular:     Rate and Rhythm: Normal rate.  Pulmonary:     Effort: Pulmonary effort is normal. No respiratory distress.  Abdominal:     Palpations: Abdomen is soft.     Tenderness: There is no abdominal tenderness.  Musculoskeletal:        General: No swelling.  Skin:    General: Skin is warm.  Neurological:     Mental Status: She is alert and oriented to person, place, and time. Mental status is at baseline.  Psychiatric:        Mood and Affect: Mood normal.     ED Results / Procedures / Treatments   Labs (all labs ordered are listed, but only abnormal results are displayed) Labs Reviewed  BASIC METABOLIC PANEL  CBC  PREGNANCY, URINE  D-DIMER, QUANTITATIVE  TSH  MAGNESIUM  SEDIMENTATION RATE  C-REACTIVE PROTEIN  TROPONIN I (HIGH SENSITIVITY)  TROPONIN I (HIGH SENSITIVITY)    EKG EKG Interpretation  Date/Time:  Tuesday October 14 2022 18:18:58 EDT Ventricular Rate:  68 PR Interval:  146 QRS Duration: 86 QT Interval:  414 QTC Calculation: 440 R  Axis:   48 Text Interpretation: Normal sinus rhythm Normal ECG Confirmed by Lavenia Atlas [phone]) on 10/14/2022 9:30:50 PM  Radiology DG Chest 2 View  Result Date: 10/14/2022 CLINICAL DATA:  Chest pain. EXAM: CHEST - 2 VIEW COMPARISON:  Chest radiograph dated 02/02/2016. FINDINGS: The heart size and mediastinal contours are within normal limits. Both lungs are clear. The visualized skeletal structures are unremarkable. IMPRESSION: No active cardiopulmonary disease. Electronically Signed   By: Anner Crete M.D.   On: 10/14/2022 19:12    Procedures Procedures    Medications Ordered in ED Medications - No data to  display  ED Course/ Medical Decision Making/ A&P                             Medical Decision Making Amount and/or Complexity of Data Reviewed Labs: ordered. Radiology: ordered.   47 year old female presents emergency department with worsening episodes of palpitations, today of which was associated with chest/arm pain, shortness of breath and dizziness.  Currently the symptoms are resolved but she does endorse worsening fatigue.  Vitals are normal on arrival.  EKG shows sinus rhythm.  Blood work is reassuring with no acute abnormalities, troponin is negative x 2.  D-dimer is normal, magnesium is normal.  TSH is normal as well.  Sent off additional inflammatory markers at the request of the patient and family at bedside.  Patient has had no arrhythmias or concerning findings on telemetry.  Will plan for outpatient follow-up with cardiology and possible rheumatology.  Patient at this time appears safe and stable for discharge and close outpatient follow up. Discharge plan and strict return to ED precautions discussed, patient verbalizes understanding and agreement.        Final Clinical Impression(s) / ED Diagnoses Final diagnoses:  None    Rx / DC Orders ED Discharge Orders     None         Lorelle Gibbs, DO 10/14/22 2327

## 2022-10-14 NOTE — ED Triage Notes (Addendum)
Pt via pov from UC with chest pain, palpitations x 2 months. Pt has been recently started on HCTZ for BP. She feels like it is worse today. She says her pain radiates to her back and left arm; endorses some dizziness earlier. Pt states she is always SOB upon exertion; denies nausea today but has had it recently. She reports that she is very tired. Pt alert & oriented, nad noted.

## 2022-10-14 NOTE — Discharge Instructions (Signed)
I would like for you to go to the nearest emergency department for further evaluation of your chest discomfort, fatigue, and heart palpitations.

## 2022-10-14 NOTE — Discharge Instructions (Signed)
You have been seen and discharged from the emergency department.  Your blood work here was normal.  Additional blood work was sent off that will take a couple days to result.  Follow-up in MyChart or with your primary providers for these results.  It is important to follow-up with cardiology for further evaluation of palpitations.  Follow-up with your primary provider for further evaluation and further care. Take home medications as prescribed. If you have any worsening symptoms or further concerns for your health please return to an emergency department for further evaluation.

## 2022-10-14 NOTE — ED Notes (Signed)
Patient is being discharged from the Urgent Care and sent to the Emergency Department via POV . Per M. Stanhope, FNP, patient is in need of higher level of care due to chest pain. Patient is aware and verbalizes understanding of plan of care.  Vitals:   10/14/22 1516  BP: (!) 142/93  Pulse: 81  Resp: 18  Temp: 98.4 F (36.9 C)  SpO2: 98%

## 2022-10-14 NOTE — ED Triage Notes (Signed)
Pt states she was wearing spanx this morning and started having chest pain, she states she was at work and got dizzy a coworker told her she lost color. She states she has taken the spanx off. She states that the chest pain has resolved pretty much but she is having left arm numbness and weakness still also dizzy. She has taken her BP meds today.

## 2022-10-15 LAB — C-REACTIVE PROTEIN: CRP: 0.5 mg/dL (ref <1.0)

## 2022-10-18 NOTE — ED Provider Notes (Signed)
[facility]    CSN: [id number] Arrival date & time: 10/14/22  1511      History   Chief Complaint No chief complaint on file.   HPI Kristi Haynes is a 47 y.o. female.   Patient presents to urgent care for evaluation of heart palpitations, left-sided chest pain, and shortness of breath that started this morning. She states she was wearing spanx when symptoms first started, but symptoms persisted despite removing spanx. While she was at work this morning, she became dizzy and had a near syncopal experience. She did not pass out. Chest pain comes and goes without known trigger and and is currently a 5 on a scale of 0-10. Chest pain is described as a sharp pain that sometimes radiates to the left arm.  Heart palpitations come and go. Dizziness worsens with position changes. She has had significant worsening fatigue over the last few days and states it is difficult for her to walk short distances without becoming short of breath. No recent trauma/injury to the chest. No acid reflux, recent nausea/vomiting, headache, or drug use. Denies history of chronic respiratory problems and recent viral URI symptoms/cough. No leg swelling or orthopnea. No history of thyroid problems. She has not attempted use of any OTC medications for pain. Patient had blood work done at a local urgent care a few weeks ago showing hyperlipidemia. Positive family history of heart attack at young age. She is a former cigarette smoker.     Past Medical History:  Diagnosis Date   Heart palpitations    Hypertension    Urinary tract infection     Patient Active Problem List   Diagnosis Date Noted   Streptococcal infection group A 08/21/2018   Fatigue 08/21/2018   Sore throat 08/21/2018   Umbilical discharge 09/18/2016   Left-sided thoracic back pain 04/11/2015   Atypical chest pain 04/11/2015    Past Surgical History:  Procedure Laterality Date   TONSILLECTOMY     "3rd tonsil" removed    OB  History     Gravida  2   Para  2   Term  2   Preterm      AB      Living  2      SAB      IAB      Ectopic      Multiple      Live Births  2            Home Medications    Prior to Admission medications   Medication Sig Start Date End Date Taking? Authorizing Provider  hydrochlorothiazide (HYDRODIURIL) 12.5 MG tablet Take 12.5 mg by mouth daily. Take half a tab 09/23/22  Yes [provider]  Multiple Vitamins-Minerals (MULTIVITAMIN ADULT PO) multivitamin    [provider]    Family History Family History  Problem Relation Age of Onset   Cancer Mother    Breast cancer Mother [id number]   High Cholesterol Brother    Hypertension Brother    Cancer Maternal Grandmother    Diabetes Maternal Grandmother    High Cholesterol Maternal Grandmother    Diabetes Maternal Grandfather    High Cholesterol Maternal Grandfather    Stroke Maternal Grandfather    Anesthesia problems Neg Hx     Social History Social History   Tobacco Use   Smoking status: Former    Types: Cigarettes    Quit date: 10/05/2009    Years since quitting: 13.0   Smokeless tobacco: Never  Vaping Use   Vaping Use: Never used  Substance Use Topics   Alcohol use: Yes    Comment: Occasional   Drug use: No     Allergies   Patient has no known allergies.   Review of Systems Review of Systems Per HPI  Physical Exam Triage Vital Signs ED Triage Vitals  Enc Vitals Group     BP 10/14/22 1516 (!) 142/93     Pulse Rate 10/14/22 1516 81     Resp 10/14/22 1516 18     Temp 10/14/22 1516 98.4 F (36.9 C)     Temp Source 10/14/22 1516 Oral     SpO2 10/14/22 1516 98 %     Weight --      Height --      Head Circumference --      Peak Flow --      Pain Score 10/14/22 1515 8     Pain Loc --      Pain Edu? --      Excl. in GC? --    No data found.  Updated Vital Signs BP (!) 142/93 (BP Location: Left Arm)   Pulse 81   Temp 98.4 F (36.9 C) (Oral)   Resp 18   LMP  10/07/2022 (Approximate)   SpO2 98%   Visual Acuity Right Eye Distance:   Left Eye Distance:   Bilateral Distance:    Right Eye Near:   Left Eye Near:    Bilateral Near:     Physical Exam Vitals and nursing note reviewed.  Constitutional:      Appearance: She is not ill-appearing or toxic-appearing.  HENT:     Head: Normocephalic and atraumatic.     Right Ear: Hearing, tympanic membrane, ear canal and external ear normal.     Left Ear: Hearing, tympanic membrane, ear canal and external ear normal.     Nose: Nose normal.     Mouth/Throat:     Lips: Pink.     Mouth: Mucous membranes are moist. No injury.     Tongue: No lesions. Tongue does not deviate from midline.     Palate: No mass and lesions.     Pharynx: Oropharynx is clear. Uvula midline. No pharyngeal swelling, oropharyngeal exudate, posterior oropharyngeal erythema or uvula swelling.     Tonsils: No tonsillar exudate or tonsillar abscesses.  Eyes:     General: Lids are normal. Vision grossly intact. Gaze aligned appropriately.     Extraocular Movements: Extraocular movements intact.     Conjunctiva/sclera: Conjunctivae normal.  Cardiovascular:     Rate and Rhythm: Normal rate and regular rhythm.     Heart sounds: Normal heart sounds, S1 normal and S2 normal.     Comments: Chest pain is not reproducible to palpation. Pulmonary:     Effort: Pulmonary effort is normal. No respiratory distress.     Breath sounds: Normal breath sounds and air entry. No wheezing, rhonchi or rales.  Musculoskeletal:     Cervical back: Neck supple.  Lymphadenopathy:     Cervical: No cervical adenopathy.  Skin:    General: Skin is warm and dry.     Capillary Refill: Capillary refill takes less than 2 seconds.     Findings: No rash.  Neurological:     General: No focal deficit present.     Mental Status: She is alert and oriented to person, place, and time. Mental status is at baseline.     Cranial Nerves: No dysarthria or facial  asymmetry.  Psychiatric:        Mood and Affect: Mood normal.        Speech: Speech normal.        Behavior: Behavior normal.        Thought Content: Thought content normal.        Judgment: Judgment normal.      UC Treatments / Results  Labs (all labs ordered are listed, but only abnormal results are displayed) Labs Reviewed - No data to display  EKG   Radiology No results found.  Procedures Procedures (including critical care time)  Medications Ordered in UC Medications - No data to display  Initial Impression / Assessment and Plan / UC Course  I have reviewed the triage vital signs and the nursing notes.  Pertinent labs & imaging results that were available during my care of the patient were reviewed by me and considered in my medical decision making (see chart for details).   1. Chest pain, heart palpitations EKG shows normal sinus rhythm without ST/T wave changes. Vitals are hemodynamically stable. She is nontoxic in appearance with clear cardiopulmonary exam. Despite stable findings on exam, patient is at elevated risk for acute cardiac etiology of her chest discomfort due to family history of early MI in 8s, HLD, former smoker, and HTN. Patient would benefit from work up in the emergency department for further evaluation to rule out ACS with serial troponins due to ongoing symptoms. Discussed this recommendation with patient and her partner at bedside who express understanding and agreement with plan. Patient discharged to the ED in stable condition by personal vehicle.  Final Clinical Impressions(s) / UC Diagnoses   Final diagnoses:  Chest pain, unspecified type  Heart palpitations     Discharge Instructions      I would like for you to go to the nearest emergency department for further evaluation of your chest discomfort, fatigue, and heart palpitations.     ED Prescriptions   None    PDMP not reviewed this encounter.   Carlisle Beers,  [place] 10/22/22 2217

## 2022-10-31 ENCOUNTER — Encounter: Payer: Self-pay | Admitting: Family Medicine

## 2022-10-31 ENCOUNTER — Ambulatory Visit: Payer: 59 | Admitting: Family Medicine

## 2022-10-31 ENCOUNTER — Telehealth: Payer: Self-pay | Admitting: Family Medicine

## 2022-10-31 VITALS — BP 102/80 | HR 79 | Temp 98.3°F | Ht 65.0 in | Wt 171.6 lb

## 2022-10-31 DIAGNOSIS — I10 Essential (primary) hypertension: Secondary | ICD-10-CM

## 2022-10-31 DIAGNOSIS — F321 Major depressive disorder, single episode, moderate: Secondary | ICD-10-CM | POA: Diagnosis not present

## 2022-10-31 DIAGNOSIS — E559 Vitamin D deficiency, unspecified: Secondary | ICD-10-CM

## 2022-10-31 DIAGNOSIS — R5383 Other fatigue: Secondary | ICD-10-CM | POA: Diagnosis not present

## 2022-10-31 DIAGNOSIS — Z1211 Encounter for screening for malignant neoplasm of colon: Secondary | ICD-10-CM

## 2022-10-31 LAB — VITAMIN B12: Vitamin B-12: 158 pg/mL — ABNORMAL LOW (ref 211–911)

## 2022-10-31 LAB — VITAMIN D 25 HYDROXY (VIT D DEFICIENCY, FRACTURES): VITD: 23.67 ng/mL — ABNORMAL LOW (ref 30.00–100.00)

## 2022-10-31 MED ORDER — HYDROCHLOROTHIAZIDE 12.5 MG PO TABS: 12.5000 mg | ORAL_TABLET | Freq: Every day | ORAL | 1 refills | Status: DC

## 2022-10-31 MED ORDER — SERTRALINE HCL 50 MG PO TABS
50.0000 mg | ORAL_TABLET | Freq: Every day | ORAL | 3 refills | Status: DC
Start: 2022-10-31 — End: 2022-11-19

## 2022-10-31 NOTE — Telephone Encounter (Signed)
[facility] is calling to clarification directions on HCTZ

## 2022-10-31 NOTE — Assessment & Plan Note (Addendum)
NEW diagnosis, symptoms progressively worsening, we had a long conversation about treatment options including medications and therapy, risks/benefits of each. Pt would like to pursue both methods of treatment. I will start sertaline 50 mg daily and place referral to psychology to begin therapy. I will follow up with her in 2 months via video visit to ensure improvement. Pt instructed to call if sx worsen or persist.

## 2022-10-31 NOTE — Progress Notes (Signed)
New Patient Office Visit  Subjective    Patient ID: Kristi Haynes, female    DOB: 1975/09/14  Age: 47 y.o. MRN: [id number]  CC:  Chief Complaint  Patient presents with   Establish Care    HPI Kristi Haynes Kristi Haynes presents to establish care Patient states she is feeling depressed. She reports issues with her heart in the past, but states that she has had follow up and testing, states that she was told that everything was normal. States she does have follow up with the cardiologist soon. Pt states she does get chest pain and a little dizzy.  Pt states for the past month she has felt very sad and very tired, no energy. Off and on for the past year but it has gotten worse. States that she has felt more forgetful. States that she does not feel like she gets enough sleep or that the sleep is less quality. States that she had started a new job two years ago, long hours, states that in the last month she has had a little bit more stress in her marriage as well.   HTN-- pt was initially taking 1/2 tablet daily of the HCTZ, states that when she went to the ER for the palpitations she was instructed to start taking a whole tablet because her BP was initially elevated.   Pt reports that her periods have starting changing, states they are getting heavier and heavier, worsening cramps, headaches, etc. Occasional hot flashes, sometimes happens at work, not sure if it from physical activity. She has a history of pelvic prolapse as well. She recently saw her gynecologist.  I reviewed some labs from 2022 and also her labs from the ED visit last week, she does have a vitamin D deficiency, not currently on supplementation, ordering new levels today.  I have reviewed all aspects of the patient's medical history including social, family, and surgical history.   Outpatient Encounter Medications as of 10/31/2022  Medication Sig   sertraline (ZOLOFT) 50 MG tablet Take 1 tablet (50 mg total) by mouth daily.    hydrochlorothiazide (HYDRODIURIL) 12.5 MG tablet Take 12.5 mg by mouth daily. Take half a tab   Multiple Vitamins-Minerals (MULTIVITAMIN ADULT PO) multivitamin   No facility-administered encounter medications on file as of 10/31/2022.    Past Medical History:  Diagnosis Date   Heart palpitations    Hypertension    Urinary tract infection     Past Surgical History:  Procedure Laterality Date   TONSILLECTOMY     "3rd tonsil" removed    Family History  Problem Relation Age of Onset   Cancer Mother    Breast cancer Mother 58   High Cholesterol Brother    Hypertension Brother    Cancer Maternal Grandmother    Diabetes Maternal Grandmother    High Cholesterol Maternal Grandmother    Diabetes Maternal Grandfather    High Cholesterol Maternal Grandfather    Stroke Maternal Grandfather    Anesthesia problems Neg Hx     Social History   Socioeconomic History   Marital status: Married    Spouse name: Not on file   Number of children: Not on file   Years of education: Not on file   Highest education level: Not on file  Occupational History   Not on file  Tobacco Use   Smoking status: Former    Types: Cigarettes    Quit date: 10/05/2009    Years since quitting: 13.0   Smokeless tobacco: Never  Vaping Use   Vaping Use: Never used  Substance and Sexual Activity   Alcohol use: Yes    Comment: Occasional   Drug use: No   Sexual activity: Yes    Birth control/protection: Surgical    Comment: vasectomy  Other Topics Concern   Not on file  Social History Narrative   Not on file   Social Determinants of Health   Financial Resource Strain: Not on file  Food Insecurity: Not on file  Transportation Needs: Not on file  Physical Activity: Not on file  Stress: Not on file  Social Connections: Not on file  Intimate Partner Violence: Not on file    Review of Systems  All other systems reviewed and are negative.       Objective    BP 102/80 (BP Location: Left  Arm, Patient Position: Sitting, Cuff Size: Normal)   Pulse 79   Temp 98.3 F (36.8 C) (Oral)   Ht  (1.651 m)   Wt 171 lb 9.6 oz (77.8 kg)   LMP 10/07/2022 (Approximate)   SpO2 99%   BMI 28.56 kg/m   Physical Exam Vitals reviewed.  Constitutional:      Appearance: Normal appearance. She is well-groomed and normal weight.  Eyes:     Conjunctiva/sclera: Conjunctivae normal.  Neck:     Thyroid: No thyromegaly.  Cardiovascular:     Rate and Rhythm: Normal rate and regular rhythm.     Pulses: Normal pulses.     Heart sounds: S1 normal and S2 normal.  Pulmonary:     Effort: Pulmonary effort is normal.     Breath sounds: Normal breath sounds and air entry.  Musculoskeletal:     Right lower leg: No edema.     Left lower leg: No edema.  Neurological:     Mental Status: She is alert and oriented to person, place, and time. Mental status is at baseline.     Gait: Gait is intact.  Psychiatric:        Attention and Perception: Attention normal.        Mood and Affect: Mood is depressed. Affect is tearful.        Speech: Speech normal.        Behavior: Behavior normal.        Judgment: Judgment normal.         Assessment & Plan:   Problem List Items Addressed This Visit       Unprioritized   Fatigue - Primary    Likely secondary to either premenopausal symptoms or depression, will check B12 level also today.      Relevant Orders   Vitamin B12   Hypertension (Chronic)    Current hypertension medications:       Sig   hydrochlorothiazide (HYDRODIURIL) 12.5 MG tablet Take 12.5 mg by mouth daily. Take half a tab       BP today is well controlled on the above medication, will continue as prescribed.      Vitamin D deficiency    Seen on previous labs in 2022, will check new level today      Relevant Orders   Vitamin D, 25-hydroxy   Current moderate episode of major depressive disorder without prior episode    NEW diagnosis, symptoms progressively worsening, we  had a long conversation about treatment options including medications and therapy, risks/benefits of each. Pt would like to pursue both methods of treatment. I will start sertaline 50 mg daily and place referral to psychology  to begin therapy. I will follow up with her in 2 months via video visit to ensure improvement. Pt instructed to call if sx worsen or persist.       Relevant Medications   sertraline (ZOLOFT) 50 MG tablet   Other Relevant Orders   Ambulatory referral to Psychology   Other Visit Diagnoses     Colon cancer screening       Relevant Orders   Ambulatory referral to Gastroenterology       Return in about 2 months (around 12/31/2022) for video visit for follow up.   Karie Georges, MD

## 2022-10-31 NOTE — Assessment & Plan Note (Signed)
Current hypertension medications:       Sig   hydrochlorothiazide (HYDRODIURIL) 12.5 MG tablet Take 12.5 mg by mouth daily. Take half a tab       BP today is well controlled on the above medication, will continue as prescribed.

## 2022-10-31 NOTE — Telephone Encounter (Signed)
Ok to refill 

## 2022-10-31 NOTE — Telephone Encounter (Signed)
Rx done. 

## 2022-10-31 NOTE — Telephone Encounter (Signed)
Pharmacy told patient they could not fill prescription for hydrochlorothiazide (HYDRODIURIL) 12.5 MG tablet due to needing clarification on directions (Sig - Route: Take 1 tablet (12.5 mg total) by mouth daily. Take half a tab - Oral )

## 2022-10-31 NOTE — Telephone Encounter (Signed)
Pt was seen by Dr. Casimiro Needle today.  She forgot to let her know that she needs a refill on HCTZ 12.5 mg.  Pharmacy- [facility] on [address].

## 2022-10-31 NOTE — Assessment & Plan Note (Signed)
Likely secondary to either premenopausal symptoms or depression, will check B12 level also today.

## 2022-10-31 NOTE — Assessment & Plan Note (Signed)
Seen on previous labs in 2022, will check new level today

## 2022-11-03 ENCOUNTER — Encounter: Payer: Self-pay | Admitting: Family Medicine

## 2022-11-03 DIAGNOSIS — I10 Essential (primary) hypertension: Secondary | ICD-10-CM

## 2022-11-03 DIAGNOSIS — F321 Major depressive disorder, single episode, moderate: Secondary | ICD-10-CM

## 2022-11-03 NOTE — Telephone Encounter (Signed)
Spoke with [facility], the pharmacist and informed him of the instructions as below.

## 2022-11-03 NOTE — Telephone Encounter (Signed)
Yes the 1/2 tablet is what she reported she was taking -- this is correct

## 2022-11-04 MED ORDER — HYDROCHLOROTHIAZIDE 25 MG PO TABS
25.0000 mg | ORAL_TABLET | Freq: Every day | ORAL | 1 refills | Status: DC
Start: 2022-11-04 — End: 2023-05-05

## 2022-11-06 ENCOUNTER — Ambulatory Visit: Payer: 59 | Admitting: Cardiology

## 2022-11-06 ENCOUNTER — Encounter: Payer: Self-pay | Admitting: Cardiology

## 2022-11-17 ENCOUNTER — Ambulatory Visit: Payer: 59 | Admitting: Cardiology

## 2022-11-19 MED ORDER — ESCITALOPRAM OXALATE 10 MG PO TABS
10.0000 mg | ORAL_TABLET | Freq: Every day | ORAL | 1 refills | Status: DC
Start: 2022-11-19 — End: 2023-05-21

## 2022-11-19 NOTE — Addendum Note (Signed)
Addended by: Karie Georges on: 11/19/2022 02:55 PM   Modules accepted: Orders

## 2022-12-17 ENCOUNTER — Ambulatory Visit: Payer: 59 | Admitting: Cardiology

## 2023-01-02 ENCOUNTER — Encounter: Payer: Self-pay | Admitting: Family Medicine

## 2023-01-02 ENCOUNTER — Telehealth: Payer: 59 | Admitting: Family Medicine

## 2023-01-02 VITALS — BP 138/85 | Ht <= 58 in | Wt 167.0 lb

## 2023-01-02 DIAGNOSIS — I10 Essential (primary) hypertension: Secondary | ICD-10-CM | POA: Diagnosis not present

## 2023-01-02 DIAGNOSIS — F321 Major depressive disorder, single episode, moderate: Secondary | ICD-10-CM | POA: Diagnosis not present

## 2023-01-02 NOTE — Assessment & Plan Note (Signed)
Mood is much improved since the last visit, doing well on 10 mg lexapro daily, will continue this as prescribed

## 2023-01-02 NOTE — Assessment & Plan Note (Signed)
BP is much better off sertraline. She is on 1 tablet daily of 25 mg hydrochlorothiazide, denies side effects to this medication, will continue as prescribed, RTC 4 months in office for BP check.

## 2023-01-02 NOTE — Progress Notes (Signed)
   Virtual Medical Office Visit  Patient:  Kristi Haynes      Age: 47 y.o.       Sex:  female  Date:   01/02/2023  PCP:    Karie Georges, MD   Today's Healthcare Provider: Karie Georges, MD    Assessment/Plan:   Summary assessment:  Kristi Haynes was seen today for medical management of chronic issues.  Primary hypertension Assessment & Plan: BP is much better off sertraline. She is on 1 tablet daily of 25 mg hydrochlorothiazide, denies side effects to this medication, will continue as prescribed, RTC 4 months in office for BP check.    Current moderate episode of major depressive disorder without prior episode [facility]) Assessment & Plan: Mood is much improved since the last visit, doing well on 10 mg lexapro daily, will continue this as prescribed      Return in about 4 months (around 05/04/2023) for HTN.   She was advised to call the office or go to ER if her condition worsens    Subjective:   Kristi Haynes is a 47 y.o. female with PMH significant for: Past Medical History:  Diagnosis Date   Heart palpitations    Hypertension    Urinary tract infection      Presenting today with: Chief Complaint  Patient presents with   Medical Management of Chronic Issues    Follow up on BP, lexapro.      She clarifies and reports that her condition: Patient reports that the lexapro is working very well for her. States that it is not causing her blood pressure to be elevated like the sertraline. Reports her anxiety and depression and under much better control. She is very happy with this medication and would like to continue it. Blood pressure at home is <140, states it is usually in the 130's now. States she is eating less salty meats, more fruits and veggies. States she has also lost some weight with this new eating pattern. She is on 1 tablet of hydrochlorothiazide daily, denies any side effects.   She denies having any: Side effects to the lexapro Headaches,  chest pain or dizziness          Objective/Observations  Physical Exam:  Polite and friendly Gen: NAD, resting comfortably Pulm: Normal work of breathing Neuro: Grossly normal, moves all extremities Psych: Normal affect and thought content Problem specific physical exam findings: smiling and pleasant    Results: No results found for any visits on 01/02/23.      I spent 20 minutes on this video enables face to face encounter today discussing pt's medications, BP readings, mood symptoms, and recommending continuation of same dosage and frequency of the medications discussed.   Virtual Visit via Video   I connected with Kristi Haynes on 01/02/23 at  9:00 AM EDT by a video enabled telemedicine application and verified that I am speaking with the correct person using two identifiers. The limitations of evaluation and management by telemedicine and the availability of in person appointments were discussed. The patient expressed understanding and agreed to proceed.   Percentage of appointment time on video:  100% Patient location: Home Provider location: [place] Persons participating in the virtual visit: Myself and Patient

## 2023-01-14 ENCOUNTER — Ambulatory Visit: Payer: 59 | Admitting: Physician Assistant

## 2023-02-17 ENCOUNTER — Encounter: Payer: Self-pay | Admitting: Cardiology

## 2023-02-17 ENCOUNTER — Ambulatory Visit: Payer: 59 | Attending: Cardiology | Admitting: Cardiology

## 2023-04-13 ENCOUNTER — Encounter: Payer: Self-pay | Admitting: Family Medicine

## 2023-05-01 ENCOUNTER — Other Ambulatory Visit: Payer: Self-pay | Admitting: Family Medicine

## 2023-05-01 DIAGNOSIS — I10 Essential (primary) hypertension: Secondary | ICD-10-CM

## 2023-05-03 ENCOUNTER — Other Ambulatory Visit: Payer: Self-pay | Admitting: Family Medicine

## 2023-05-03 DIAGNOSIS — F321 Major depressive disorder, single episode, moderate: Secondary | ICD-10-CM

## 2023-05-03 DIAGNOSIS — I10 Essential (primary) hypertension: Secondary | ICD-10-CM

## 2023-05-04 NOTE — Telephone Encounter (Signed)
Pt called to say she only has one pill left. Pt is already scheduled for 05/29/23.   hydrochlorothiazide (HYDRODIURIL) 25 MG tablet  [id number] - [place], [place] - [address] AT [facility] [address] [facility] [address] Phone: [phone]  Fax: [phone]

## 2023-05-08 ENCOUNTER — Ambulatory Visit: Payer: 59 | Admitting: Family Medicine

## 2023-05-21 ENCOUNTER — Encounter: Payer: Self-pay | Admitting: Family Medicine

## 2023-05-21 DIAGNOSIS — F321 Major depressive disorder, single episode, moderate: Secondary | ICD-10-CM

## 2023-05-21 MED ORDER — ESCITALOPRAM OXALATE 10 MG PO TABS
10.0000 mg | ORAL_TABLET | Freq: Every day | ORAL | 1 refills | Status: DC
Start: 2023-05-21 — End: 2023-09-11

## 2023-05-25 NOTE — Telephone Encounter (Signed)
Due to work patient had to resch her 05/29/2023 appt to 06/19/23@2 . Concerned that her meds may not last until the rescheduled date.

## 2023-05-29 ENCOUNTER — Ambulatory Visit: Payer: 59 | Admitting: Family Medicine

## 2023-06-15 ENCOUNTER — Telehealth: Payer: Self-pay | Admitting: Family Medicine

## 2023-06-15 NOTE — Telephone Encounter (Signed)
Pt states pharmacy does not have refills for her hydrochlorothiazide (HYDRODIURIL) 25 MG tablet, written for 90 and a refill in october

## 2023-06-16 NOTE — Telephone Encounter (Signed)
Noted  

## 2023-06-16 NOTE — Telephone Encounter (Signed)
See Mychart message-patient spoke with the pharmacy and resolved the issue.

## 2023-07-12 ENCOUNTER — Other Ambulatory Visit: Payer: Self-pay | Admitting: Family Medicine

## 2023-07-12 DIAGNOSIS — I10 Essential (primary) hypertension: Secondary | ICD-10-CM

## 2023-07-17 ENCOUNTER — Encounter: Payer: Self-pay | Admitting: Family Medicine

## 2023-07-17 ENCOUNTER — Ambulatory Visit: Payer: 59 | Admitting: Family Medicine

## 2023-07-17 VITALS — BP 112/80 | HR 80 | Temp 97.8°F | Ht 65.0 in | Wt 186.2 lb

## 2023-07-17 DIAGNOSIS — F321 Major depressive disorder, single episode, moderate: Secondary | ICD-10-CM | POA: Diagnosis not present

## 2023-07-17 DIAGNOSIS — I10 Essential (primary) hypertension: Secondary | ICD-10-CM

## 2023-07-17 DIAGNOSIS — Z1211 Encounter for screening for malignant neoplasm of colon: Secondary | ICD-10-CM | POA: Diagnosis not present

## 2023-07-17 DIAGNOSIS — Z124 Encounter for screening for malignant neoplasm of cervix: Secondary | ICD-10-CM

## 2023-07-17 NOTE — Patient Instructions (Addendum)
 Reduce lexapro to 5 mg (1/2) tablet daily for 30 days, then ok to try stopping the medication.  Total calories per day: 1540  Total carbs: 96 grams per day  Total protein: at least 90- 100 grams per day

## 2023-07-17 NOTE — Progress Notes (Signed)
 Established Patient Office Visit  Subjective   Patient ID: Kristi Haynes, female    DOB: Oct 13, 1975  Age: 48 y.o. MRN: [id number]  Chief Complaint  Patient presents with   Medical Management of Chronic Issues    Pt is here for follow up today.  HTN -- BP in office performed and is well controlled. She  reports no side effects to the medications, no chest pain, SOB, dizziness or headaches. She has a BP cuff at home and is checking BP regularly, reports they are in the normal range.   Depression/anxiety -- pt states she is on the 10 mg lexapro , states that she figured out her mood swings were mostly centered around her periods. States that she is having a lot heavier periods, more clotting, more blood present. Is having to change her tampons sometimes every hour for about 2-3 days, then it gets better. Has a follow up appointment with GYN, will discuss with her. Is also reporting hot flashes and night sweats intermittently.       Current Outpatient Medications  Medication Instructions   Cyanocobalamin  (VITAMIN B-12 PO) Take by mouth. 1000 international unit   escitalopram  (LEXAPRO ) 10 mg, Oral, Daily   hydrochlorothiazide  (HYDRODIURIL ) 25 mg, Oral, Daily   VITAMIN D  PO Take by mouth. Vitamin D3(2000 international unit)    Patient Active Problem List   Diagnosis Date Noted   Hypertension 10/31/2022   Vitamin D  deficiency 10/31/2022   Current moderate episode of major depressive disorder without prior episode (HCC) 10/31/2022   Streptococcal infection group A 08/21/2018   Fatigue 08/21/2018   Sore throat 08/21/2018   Umbilical discharge 09/18/2016   Left-sided thoracic back pain 04/11/2015   Atypical chest pain 04/11/2015      Review of Systems  All other systems reviewed and are negative.     Objective:     BP 112/80   Pulse 80   Temp 97.8 F (36.6 C) (Oral)   Ht 5' 5 (1.651 m)   Wt 186 lb 3.2 oz (84.5 kg)   LMP 07/12/2023 (Exact Date)   SpO2 99%    BMI 30.99 kg/m    Physical Exam Vitals reviewed.  Constitutional:      Appearance: Normal appearance. She is normal weight.  Cardiovascular:     Rate and Rhythm: Normal rate and regular rhythm.     Heart sounds: Normal heart sounds. No murmur heard. Pulmonary:     Effort: Pulmonary effort is normal.     Breath sounds: Normal breath sounds. No wheezing.  Musculoskeletal:     Right lower leg: No edema.     Left lower leg: No edema.  Neurological:     Mental Status: She is alert and oriented to person, place, and time. Mental status is at baseline.  Psychiatric:        Mood and Affect: Mood normal.        Behavior: Behavior normal.      No results found for any visits on 07/17/23.    The ASCVD Risk score (Arnett DK, et al., 2019) failed to calculate for the following reasons:   Cannot find a previous HDL lab   Cannot find a previous total cholesterol lab    Assessment & Plan:  Primary hypertension Assessment & Plan: Current hypertension medications:       Sig   hydrochlorothiazide  (HYDRODIURIL ) 25 MG tablet (Taking) TAKE 1 TABLET BY MOUTH DAILY     Chronic, stable. BP is well controlled on the above  medications, will continue as prescribed.     Colon cancer screening -     Cologuard  Current moderate episode of major depressive disorder without prior episode [facility]) Assessment & Plan: Chronic, stable, likely related to hormonal changes around perimenopause, Good control of her symptoms, she is interested in trying to wean off the medication due to experiencing weight gain with the medicine. I advised she reduce to 5 mg daily for about a month, then if she does not have recurrence of her symptoms then it would be ok to stop the medication. I gave her verbal and written instructions on this.   Weight gain-- likely secondary to SSRI use, I have had an extensive 30 minute conversation today with the patient about healthy eating habits, exercise, calorie and carb goals  for sustainable and successful weight loss. I gave the patient caloric and protein daily intake values as well as described the importance of increasing fiber and water intake. I discussed weight loss medications that could be used in the treatment of this patient. Handouts on low carb eating were given to the patient.        Return in about 4 months (around 11/14/2023) for annual physical exam.    Heron CHRISTELLA Sharper, MD

## 2023-07-21 NOTE — Assessment & Plan Note (Signed)
 Current hypertension medications:       Sig   hydrochlorothiazide (HYDRODIURIL) 25 MG tablet (Taking) TAKE 1 TABLET BY MOUTH DAILY     Chronic, stable. BP is well controlled on the above medications, will continue as prescribed.

## 2023-07-21 NOTE — Assessment & Plan Note (Addendum)
 Chronic, stable, likely related to hormonal changes around perimenopause, Good control of her symptoms, she is interested in trying to wean off the medication due to experiencing weight gain with the medicine. I advised she reduce to 5 mg daily for about a month, then if she does not have recurrence of her symptoms then it would be ok to stop the medication. I gave her verbal and written instructions on this.   Weight gain-- likely secondary to SSRI use, I have had an extensive 30 minute conversation today with the patient about healthy eating habits, exercise, calorie and carb goals for sustainable and successful weight loss. I gave the patient caloric and protein daily intake values as well as described the importance of increasing fiber and water intake. I discussed weight loss medications that could be used in the treatment of this patient. Handouts on low carb eating were given to the patient.

## 2023-07-31 ENCOUNTER — Encounter: Payer: Self-pay | Admitting: Family Medicine

## 2023-09-11 ENCOUNTER — Encounter: Payer: Self-pay | Admitting: Family Medicine

## 2023-09-11 DIAGNOSIS — F321 Major depressive disorder, single episode, moderate: Secondary | ICD-10-CM

## 2023-09-11 MED ORDER — ESCITALOPRAM OXALATE 20 MG PO TABS: 20.0000 mg | ORAL_TABLET | Freq: Every day | ORAL | Status: DC

## 2023-09-11 MED ORDER — ESCITALOPRAM OXALATE 20 MG PO TABS: 20.0000 mg | ORAL_TABLET | Freq: Every day | ORAL | 1 refills | Status: DC

## 2023-11-13 ENCOUNTER — Ambulatory Visit (INDEPENDENT_AMBULATORY_CARE_PROVIDER_SITE_OTHER)

## 2023-11-13 ENCOUNTER — Encounter: Payer: Self-pay | Admitting: Family Medicine

## 2023-11-13 ENCOUNTER — Ambulatory Visit (INDEPENDENT_AMBULATORY_CARE_PROVIDER_SITE_OTHER): Payer: 59 | Admitting: Family Medicine

## 2023-11-13 VITALS — BP 140/90 | HR 75 | Temp 98.3°F | Ht 64.5 in | Wt 190.7 lb

## 2023-11-13 DIAGNOSIS — E538 Deficiency of other specified B group vitamins: Secondary | ICD-10-CM | POA: Diagnosis not present

## 2023-11-13 DIAGNOSIS — Z1211 Encounter for screening for malignant neoplasm of colon: Secondary | ICD-10-CM

## 2023-11-13 DIAGNOSIS — Z0000 Encounter for general adult medical examination without abnormal findings: Secondary | ICD-10-CM

## 2023-11-13 DIAGNOSIS — E559 Vitamin D deficiency, unspecified: Secondary | ICD-10-CM | POA: Diagnosis not present

## 2023-11-13 DIAGNOSIS — R635 Abnormal weight gain: Secondary | ICD-10-CM | POA: Diagnosis not present

## 2023-11-13 DIAGNOSIS — M25552 Pain in left hip: Secondary | ICD-10-CM

## 2023-11-13 DIAGNOSIS — I10 Essential (primary) hypertension: Secondary | ICD-10-CM

## 2023-11-13 NOTE — Progress Notes (Unsigned)
 Complete physical exam  Patient: Kristi Haynes   DOB: 25-Jul-1975   48 y.o. Female  MRN: [id number]  Subjective:    Chief Complaint  Patient presents with  . Annual Exam    Kristi Haynes is a 48 y.o. female who presents today for a complete physical exam. She reports consuming a  portion controlled  diet. The patient does not participate in regular exercise at present. She generally feels well. She reports sleeping multiple nighttime awakenings, states she has hip pain at night, occaisional heart burn, hot flashes. She does not have additional problems to discuss today.    Most recent fall risk assessment:    08/21/2018    9:22 AM  Fall Risk   Falls in the past year? 0  Injury with Fall? 0     Most recent depression screenings:    11/13/2023    2:14 PM 07/17/2023    2:34 PM  PHQ 2/9 Scores  PHQ - 2 Score 2 1  PHQ- 9 Score 5 8    Vision:Within last year and Dental: No current dental problems and Last dental visit: 6 months ago, needs a new dentist  Patient Active Problem List   Diagnosis Date Noted  . Hypertension 10/31/2022  . Vitamin D  deficiency 10/31/2022  . Current moderate episode of major depressive disorder without prior episode (HCC) 10/31/2022  . Streptococcal infection group A 08/21/2018  . Fatigue 08/21/2018  . Sore throat 08/21/2018  . Umbilical discharge 09/18/2016  . Left-sided thoracic back pain 04/11/2015  . Atypical chest pain 04/11/2015      Patient Care Team: Aida House, MD as PCP - General (Family Medicine)   Outpatient Medications Prior to Visit  Medication Sig  . Cyanocobalamin  (VITAMIN B-12 PO) Take by mouth. 1000 international unit  . hydrochlorothiazide  (HYDRODIURIL ) 25 MG tablet TAKE 1 TABLET BY MOUTH DAILY  . VITAMIN D  PO Take by mouth. Vitamin D3(2000 international unit)  . [DISCONTINUED] escitalopram  (LEXAPRO ) 20 MG tablet Take 1 tablet (20 mg total) by mouth daily.   No facility-administered medications prior to  visit.    ROS     Objective:     BP (!) 140/90   Pulse 75   Temp 98.3 F (36.8 C) (Oral)   Ht 5' 4.5" (1.638 m)   Wt 190 lb 11.2 oz (86.5 kg)   LMP 11/06/2023 (Exact Date)   SpO2 99%   BMI 32.23 kg/m  {Vitals History (Optional):23777}  Physical Exam Vitals reviewed.  Constitutional:      Appearance: Normal appearance. She is well-groomed. She is obese.  HENT:     Right Ear: Tympanic membrane and ear canal normal.     Left Ear: Tympanic membrane and ear canal normal.     Mouth/Throat:     Mouth: Mucous membranes are moist.     Pharynx: No posterior oropharyngeal erythema.  Eyes:     Conjunctiva/sclera: Conjunctivae normal.  Neck:     Thyroid : No thyromegaly.  Cardiovascular:     Rate and Rhythm: Normal rate and regular rhythm.     Pulses: Normal pulses.     Heart sounds: S1 normal and S2 normal.  Pulmonary:     Effort: Pulmonary effort is normal.     Breath sounds: Normal breath sounds and air entry.  Abdominal:     General: Abdomen is flat. Bowel sounds are normal.     Palpations: Abdomen is soft.  Musculoskeletal:     Right lower leg: No edema.  Left lower leg: No edema.  Lymphadenopathy:     Cervical: No cervical adenopathy.  Neurological:     Mental Status: She is alert and oriented to person, place, and time. Mental status is at baseline.     Gait: Gait is intact.  Psychiatric:        Mood and Affect: Mood and affect normal.        Speech: Speech normal.        Behavior: Behavior normal.        Judgment: Judgment normal.     No results found for any visits on 11/13/23. {Show previous labs (optional):23779}    Assessment & Plan:    Routine Health Maintenance and Physical Exam  Immunization History  Administered Date(s) Administered  . Influenza-Unspecified 05/23/2017  . Rho (D) Immune Globulin  06/09/2011  . Tdap 06/09/2011, 07/16/2018    Health Maintenance  Topic Date Due  . Colonoscopy  Never done  . COVID-19 Vaccine (1 - 2024-25  season) Never done  . Hepatitis C Screening  07/16/2024 (Originally 05/30/1992)  . HIV Screening  11/12/2024 (Originally 05/30/1989)  . INFLUENZA VACCINE  02/12/2024  . Cervical Cancer Screening (HPV/Pap Cotest)  09/05/2025  . DTaP/Tdap/Td (3 - Td or Tdap) 07/16/2028  . HPV VACCINES  Aged Out  . Meningococcal B Vaccine  Aged Out    Discussed health benefits of physical activity, and encouraged her to engage in regular exercise appropriate for her age and condition.  Vitamin D  deficiency -     VITAMIN D  25 Hydroxy (Vit-D Deficiency, Fractures); Future  Primary hypertension -     Lipid panel; Future -     CBC with Differential/Platelet; Future -     Comprehensive metabolic panel with GFR; Future  Weight gain -     TSH; Future  B12 deficiency -     Vitamin B12; Future  Routine general medical examination at a health care facility  Left hip pain -     DG HIP UNILAT W OR W/O PELVIS 2-3 VIEWS LEFT; Future  Colon cancer screening -     Cologuard    Return in 3 months (on 02/13/2024).     Aida House, MD

## 2023-11-13 NOTE — Patient Instructions (Addendum)
 Wegovy or Zepbound -- ask about coverage of these medications for weight loss??   Pepcid AC 1-2 tablet every night before bed  Health Maintenance, Female Adopting a healthy lifestyle and getting preventive care are important in promoting health and wellness. Ask your health care provider about: The right schedule for you to have regular tests and exams. Things you can do on your own to prevent diseases and keep yourself healthy. What should I know about diet, weight, and exercise? Eat a healthy diet  Eat a diet that includes plenty of vegetables, fruits, low-fat dairy products, and lean protein. Do not eat a lot of foods that are high in solid fats, added sugars, or sodium. Maintain a healthy weight Body mass index (BMI) is used to identify weight problems. It estimates body fat based on height and weight. Your health care provider can help determine your BMI and help you achieve or maintain a healthy weight. Get regular exercise Get regular exercise. This is one of the most important things you can do for your health. Most adults should: Exercise for at least 150 minutes each week. The exercise should increase your heart rate and make you sweat (moderate-intensity exercise). Do strengthening exercises at least twice a week. This is in addition to the moderate-intensity exercise. Spend less time sitting. Even light physical activity can be beneficial. Watch cholesterol and blood lipids Have your blood tested for lipids and cholesterol at 50 years of age, then have this test every 5 years. Have your cholesterol levels checked more often if: Your lipid or cholesterol levels are high. You are older than 49 years of age. You are at high risk for heart disease. What should I know about cancer screening? Depending on your health history and family history, you may need to have cancer screening at various ages. This may include screening for: Breast cancer. Cervical cancer. Colorectal  cancer. Skin cancer. Lung cancer. What should I know about heart disease, diabetes, and high blood pressure? Blood pressure and heart disease High blood pressure causes heart disease and increases the risk of stroke. This is more likely to develop in people who have high blood pressure readings or are overweight. Have your blood pressure checked: Every 3-5 years if you are 50-50 years of age. Every year if you are 50 years old or older. Diabetes Have regular diabetes screenings. This checks your fasting blood sugar level. Have the screening done: Once every three years after age 18 if you are at a normal weight and have a low risk for diabetes. More often and at a younger age if you are overweight or have a high risk for diabetes. What should I know about preventing infection? Hepatitis B If you have a higher risk for hepatitis B, you should be screened for this virus. Talk with your health care provider to find out if you are at risk for hepatitis B infection. Hepatitis C Testing is recommended for: Everyone born from [birth year] through 1967. Anyone with known risk factors for hepatitis C. Sexually transmitted infections (STIs) Get screened for STIs, including gonorrhea and chlamydia, if: You are sexually active and are younger than 50 years of age. You are older than 50 years of age and your health care provider tells you that you are at risk for this type of infection. Your sexual activity has changed since you were last screened, and you are at increased risk for chlamydia or gonorrhea. Ask your health care provider if you are at risk. Ask your health  care provider about whether you are at high risk for HIV. Your health care provider may recommend a prescription medicine to help prevent HIV infection. If you choose to take medicine to prevent HIV, you should first get tested for HIV. You should then be tested every 3 months for as long as you are taking the medicine. Pregnancy If you are  about to stop having your period (premenopausal) and you may become pregnant, seek counseling before you get pregnant. Take 400 to 800 micrograms (mcg) of folic acid every day if you become pregnant. Ask for birth control (contraception) if you want to prevent pregnancy. Osteoporosis and menopause Osteoporosis is a disease in which the bones lose minerals and strength with aging. This can result in bone fractures. If you are 30 years old or older, or if you are at risk for osteoporosis and fractures, ask your health care provider if you should: Be screened for bone loss. Take a calcium or vitamin D  supplement to lower your risk of fractures. Be given hormone replacement therapy (HRT) to treat symptoms of menopause. Follow these instructions at home: Alcohol use Do not drink alcohol if: Your health care provider tells you not to drink. You are pregnant, may be pregnant, or are planning to become pregnant. If you drink alcohol: Limit how much you have to: 0-1 drink a day. Know how much alcohol is in your drink. In the [place] one drink equals one 12 oz bottle of beer (355 mL), one 5 oz glass of wine (148 mL), or one 1 oz glass of hard liquor (44 mL). Lifestyle Do not use any products that contain nicotine or tobacco. These products include cigarettes, chewing tobacco, and vaping devices, such as e-cigarettes. If you need help quitting, ask your health care provider. Do not use street drugs. Do not share needles. Ask your health care provider for help if you need support or information about quitting drugs. General instructions Schedule regular health, dental, and eye exams. Stay current with your vaccines. Tell your health care provider if: You often feel depressed. You have ever been abused or do not feel safe at home. Summary Adopting a healthy lifestyle and getting preventive care are important in promoting health and wellness. Follow your health care provider's instructions about  healthy diet, exercising, and getting tested or screened for diseases. Follow your health care provider's instructions on monitoring your cholesterol and blood pressure. This information is not intended to replace advice given to you by your health care provider. Make sure you discuss any questions you have with your health care provider. Document Revised: 11/19/2020 Document Reviewed: 11/19/2020 Elsevier Patient Education  2024 [employer].

## 2023-11-14 ENCOUNTER — Encounter: Payer: Self-pay | Admitting: Family Medicine

## 2023-11-14 LAB — COMPREHENSIVE METABOLIC PANEL WITH GFR
ALT: 12 IU/L (ref 0–32)
AST: 24 IU/L (ref 0–40)
Albumin: 4.2 g/dL (ref 3.9–4.9)
Alkaline Phosphatase: 68 IU/L (ref 44–121)
BUN/Creatinine Ratio: 16 (ref 9–23)
BUN: 9 mg/dL (ref 6–24)
Bilirubin Total: 0.2 mg/dL (ref 0.0–1.2)
CO2: 25 mmol/L (ref 20–29)
Calcium: 9 mg/dL (ref 8.7–10.2)
Chloride: 101 mmol/L (ref 96–106)
Creatinine, Ser: 0.56 mg/dL — ABNORMAL LOW (ref 0.57–1.00)
Globulin, Total: 2.4 g/dL (ref 1.5–4.5)
Glucose: 84 mg/dL (ref 70–99)
Potassium: 3.9 mmol/L (ref 3.5–5.2)
Sodium: 140 mmol/L (ref 134–144)
Total Protein: 6.6 g/dL (ref 6.0–8.5)
eGFR: 112 mL/min/{1.73_m2} (ref >59)

## 2023-11-14 LAB — CBC WITH DIFFERENTIAL/PLATELET
Basophils Absolute: 0.1 10*3/uL (ref 0.0–0.2)
Basos: 1 %
EOS (ABSOLUTE): 0.2 10*3/uL (ref 0.0–0.4)
Eos: 2 %
Hematocrit: 35.3 % (ref 34.0–46.6)
Hemoglobin: 11.7 g/dL (ref 11.1–15.9)
Immature Grans (Abs): 0 10*3/uL (ref 0.0–0.1)
Immature Granulocytes: 0 %
Lymphocytes Absolute: 2.7 10*3/uL (ref 0.7–3.1)
Lymphs: 33 %
MCH: 30.3 pg (ref 26.6–33.0)
MCHC: 33.1 g/dL (ref 31.5–35.7)
MCV: 92 fL (ref 79–97)
Monocytes Absolute: 0.8 10*3/uL (ref 0.1–0.9)
Monocytes: 10 %
Neutrophils Absolute: 4.3 10*3/uL (ref 1.4–7.0)
Neutrophils: 54 %
Platelets: 332 10*3/uL (ref 150–450)
RBC: 3.86 x10E6/uL (ref 3.77–5.28)
RDW: 12.3 % (ref 11.7–15.4)
WBC: 8.1 10*3/uL (ref 3.4–10.8)

## 2023-11-14 LAB — LIPID PANEL
Chol/HDL Ratio: 3.8 ratio (ref 0.0–4.4)
Cholesterol, Total: 180 mg/dL (ref 100–199)
HDL: 47 mg/dL (ref >39)
LDL Chol Calc (NIH): 109 mg/dL — ABNORMAL HIGH (ref 0–99)
Triglycerides: 138 mg/dL (ref 0–149)
VLDL Cholesterol Cal: 24 mg/dL (ref 5–40)

## 2023-11-14 LAB — VITAMIN D 25 HYDROXY (VIT D DEFICIENCY, FRACTURES): Vit D, 25-Hydroxy: 35 ng/mL (ref 30.0–100.0)

## 2023-11-14 LAB — TSH: TSH: 1.77 u[IU]/mL (ref 0.450–4.500)

## 2023-11-14 LAB — VITAMIN B12: Vitamin B-12: 432 pg/mL (ref 232–1245)

## 2023-11-14 NOTE — Assessment & Plan Note (Signed)
Seen on previous labs in 2022, will check new level today

## 2023-11-14 NOTE — Assessment & Plan Note (Signed)
 Current hypertension medications:       Sig   hydrochlorothiazide  (HYDRODIURIL ) 25 MG tablet (Taking) TAKE 1 TABLET BY MOUTH DAILY     Chronic, stable. BP is slightly borderline today, could be from additional weight gain, I advised she continue to monitor her pressure and continue her medications as prescribed.

## 2023-11-17 ENCOUNTER — Encounter: Payer: Self-pay | Admitting: Family Medicine

## 2023-11-17 DIAGNOSIS — M1632 Unilateral osteoarthritis resulting from hip dysplasia, left hip: Secondary | ICD-10-CM

## 2023-12-12 ENCOUNTER — Other Ambulatory Visit: Payer: Self-pay | Admitting: Family Medicine

## 2023-12-12 DIAGNOSIS — I10 Essential (primary) hypertension: Secondary | ICD-10-CM

## 2024-01-18 ENCOUNTER — Encounter: Payer: Self-pay | Admitting: Family Medicine

## 2024-03-17 ENCOUNTER — Other Ambulatory Visit: Payer: Self-pay | Admitting: Family Medicine

## 2024-03-17 ENCOUNTER — Encounter: Payer: Self-pay | Admitting: Family Medicine

## 2024-03-17 DIAGNOSIS — I10 Essential (primary) hypertension: Secondary | ICD-10-CM

## 2024-05-12 ENCOUNTER — Encounter: Payer: Self-pay | Admitting: Family Medicine

## 2024-05-12 DIAGNOSIS — Z1211 Encounter for screening for malignant neoplasm of colon: Secondary | ICD-10-CM

## 2024-05-12 DIAGNOSIS — F321 Major depressive disorder, single episode, moderate: Secondary | ICD-10-CM

## 2024-05-13 MED ORDER — ESCITALOPRAM OXALATE 10 MG PO TABS: 10.0000 mg | ORAL_TABLET | Freq: Every day | ORAL | 0 refills | Status: DC

## 2024-05-16 ENCOUNTER — Encounter: Payer: Self-pay | Admitting: Radiology

## 2024-05-25 NOTE — Telephone Encounter (Signed)
 Ok to cancel the cologuard and send her to gastro. Thanks!

## 2024-06-21 ENCOUNTER — Other Ambulatory Visit: Payer: Self-pay | Admitting: Family Medicine

## 2024-06-21 DIAGNOSIS — I10 Essential (primary) hypertension: Secondary | ICD-10-CM

## 2024-08-19 ENCOUNTER — Other Ambulatory Visit: Payer: Self-pay | Admitting: Family Medicine

## 2024-08-19 DIAGNOSIS — F321 Major depressive disorder, single episode, moderate: Secondary | ICD-10-CM
